# Patient Record
Sex: Male | Born: 1990 | Race: Black or African American | Hispanic: No | Marital: Single | Smoking: Never smoker
Health system: Southern US, Community
[De-identification: ages and names within clinical notes are randomized; demographics above are authoritative.]

## PROBLEM LIST (undated history)

## (undated) DIAGNOSIS — G809 Cerebral palsy, unspecified: Secondary | ICD-10-CM

## (undated) DIAGNOSIS — F840 Autistic disorder: Secondary | ICD-10-CM

## (undated) HISTORY — PX: FOOT CAPSULE RELEASE W/ PERCUTANEOUS HEEL CORD LENGTHENING, TIBIAL TENDON TRANSFER: SHX1658

---
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
## 2018-02-09 ENCOUNTER — Ambulatory Visit (INDEPENDENT_AMBULATORY_CARE_PROVIDER_SITE_OTHER): Payer: Medicaid Other | Admitting: Orthopaedic Surgery

## 2018-02-09 ENCOUNTER — Ambulatory Visit (INDEPENDENT_AMBULATORY_CARE_PROVIDER_SITE_OTHER): Payer: Medicaid Other

## 2018-02-09 ENCOUNTER — Encounter (INDEPENDENT_AMBULATORY_CARE_PROVIDER_SITE_OTHER): Payer: Self-pay | Admitting: Orthopaedic Surgery

## 2018-02-09 VITALS — BP 120/88 | HR 88 | Ht 67.0 in | Wt 314.0 lb

## 2018-02-09 DIAGNOSIS — M25572 Pain in left ankle and joints of left foot: Secondary | ICD-10-CM | POA: Diagnosis not present

## 2018-02-09 DIAGNOSIS — M79672 Pain in left foot: Secondary | ICD-10-CM | POA: Diagnosis not present

## 2018-02-09 NOTE — Progress Notes (Signed)
Office Visit Note   Patient: Albert Horne           Date of Birth: August 05, 1991           MRN: [id number] Visit Date: 02/09/2018              Requested by: No referring provider defined for this encounter. PCP: Patient, No Pcp Per   Assessment & Plan: Visit Diagnoses:  1. Pain in left ankle and joints of left foot   2. Pain in left foot     Plan: Patient needs to go on a diet needs to lose 100 pounds gradually over the next 2 years.  We discussed diet techniques with increased activity less food intake, glass of water before meals using vegetables and fruits, avoiding sugary drinks etc.  He states he likes to eat.  We discussed callus care again in his family needs to debride the callus more vigorously which is just dead hypertrophic skin.  Currently has no blister formation has no tenderness.  Heel cord release loss of foot to dorsiflex to neutral now is no longer walking on his toes.  Hamstrings allowed near full extension of his knees in the sitting position.  Abductors are not tight.  Follow-Up Instructions: Return if symptoms worsen or fail to improve.   Orders:  Orders Placed This Encounter  Procedures  . XR Ankle Complete Left  . XR Foot Complete Left   No orders of the defined types were placed in this encounter.     Procedures: No procedures performed   Clinical Data: No additional findings.   Subjective: Chief Complaint  Patient presents with  . Left Foot - Pain, Edema    S/p slip and fall down steps approximately one month ago  . 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  Bilateral heel cord releases were done.  He has had problems with some planovalgus foot deformity and has callus along the medial aspect of the great toe which remains hypertrophic.  He has not had any blister formation does not really  have any pain with the thick calluses.  He is significantly gained weight and now weighs 314 pounds with a BMI of 49 and is morbidly obese.  Review of Systems positive for previous heel cord releases.  Morbid obesity with significant increased weight gain.  Abnormal gait related to spasticity.   Objective: Vital Signs: BP 120/88 (BP Location: Left Arm, Patient Position: Sitting, Cuff Size: Large)   Pulse 88   Ht 5\' 7"  (1.702 m)   Wt (!) 314 lb (142.4 kg)   BMI 49.18 kg/m   Physical Exam  Constitutional: He is oriented to person, place, and time. He appears well-developed and well-nourished.  Obese  HENT:  Head: Normocephalic and atraumatic.  Eyes: Pupils are equal, round, and reactive to light. EOM are normal.  Neck: No tracheal deviation present. No thyromegaly present.  Cardiovascular: Normal rate.  Pulmonary/Chest: Effort normal. He has no wheezes.  Abdominal: Soft. Bowel sounds are normal.  Neurological: He is alert and oriented to person, place, and time.  Skin: Skin is warm and dry. Capillary refill takes less than 2 seconds.  Psychiatric: He has a normal mood and affect. His behavior is normal. Judgment and thought content normal.    Ortho Exam ankles are dorsiflexed to neutral position.  He ambulates with feet and external rotation drags his foot some and has some pronation.  Thick calluses 1 cm present along the medial aspect of the great toe.  Specialty Comments:  No specialty comments available.  Imaging: Xr Ankle Complete Left  Result Date: 02/09/2018 Three-view x-rays left ankle obtained and reviewed.  There is degenerative spurring anteriorly over the talar neck without corresponding distal tibial spurring.  No evidence of tarsal coalition negative for acute fracture. Impression: Bone formation anteriorly over the neck of the talus.  No ankle or subtalar arthritis.  Xr Foot Complete Left  Result Date: 02/09/2018 Three-view x-rays left foot obtained and reviewed.   Negative for acute fracture.  There is some bone formation dorsal to the neck of the talus without corresponding degenerative spurring on the distal tibia.  No ankle arthrosis.  Negative for acute fracture. Impression negative for acute changes.  Spurring noted dorsal to the talar neck as described above.    PMFS History: There are no active problems to display for this patient.  History reviewed. No pertinent past medical history.  History reviewed. No pertinent family history.  History reviewed. No pertinent surgical history. Social History   Occupational History  . Not on file  Tobacco Use  . Smoking status: Not on file  Substance and Sexual Activity  . Alcohol use: Not on file  . Drug use: Not on file  . Sexual activity: Not on file

## 2022-04-16 ENCOUNTER — Emergency Department (HOSPITAL_COMMUNITY): Payer: Medicaid - Out of State

## 2022-04-16 ENCOUNTER — Inpatient Hospital Stay (HOSPITAL_COMMUNITY)
Admission: EM | Admit: 2022-04-16 | Discharge: 2022-04-20 | DRG: 571 | Disposition: A | Discharge status: Home / Self Care | Payer: Medicaid - Out of State | Attending: Family Medicine | Admitting: Family Medicine

## 2022-04-16 ENCOUNTER — Encounter (HOSPITAL_COMMUNITY): Payer: Self-pay | Admitting: *Deleted

## 2022-04-16 ENCOUNTER — Other Ambulatory Visit: Payer: Self-pay

## 2022-04-16 DIAGNOSIS — L089 Local infection of the skin and subcutaneous tissue, unspecified: Secondary | ICD-10-CM

## 2022-04-16 DIAGNOSIS — L97529 Non-pressure chronic ulcer of other part of left foot with unspecified severity: Secondary | ICD-10-CM | POA: Diagnosis present

## 2022-04-16 DIAGNOSIS — Z713 Dietary counseling and surveillance: Secondary | ICD-10-CM

## 2022-04-16 DIAGNOSIS — D72829 Elevated white blood cell count, unspecified: Secondary | ICD-10-CM | POA: Diagnosis present

## 2022-04-16 DIAGNOSIS — L03116 Cellulitis of left lower limb: Secondary | ICD-10-CM | POA: Diagnosis present

## 2022-04-16 DIAGNOSIS — F840 Autistic disorder: Secondary | ICD-10-CM | POA: Diagnosis present

## 2022-04-16 DIAGNOSIS — Z6841 Body Mass Index (BMI) 40.0 and over, adult: Secondary | ICD-10-CM | POA: Diagnosis not present

## 2022-04-16 DIAGNOSIS — M869 Osteomyelitis, unspecified: Secondary | ICD-10-CM

## 2022-04-16 DIAGNOSIS — G809 Cerebral palsy, unspecified: Secondary | ICD-10-CM | POA: Diagnosis present

## 2022-04-16 DIAGNOSIS — L02612 Cutaneous abscess of left foot: Principal | ICD-10-CM | POA: Diagnosis present

## 2022-04-16 DIAGNOSIS — L0390 Cellulitis, unspecified: Secondary | ICD-10-CM | POA: Diagnosis present

## 2022-04-16 DIAGNOSIS — L7622 Postprocedural hemorrhage and hematoma of skin and subcutaneous tissue following other procedure: Secondary | ICD-10-CM | POA: Diagnosis present

## 2022-04-16 HISTORY — DX: Cerebral palsy, unspecified: G80.9

## 2022-04-16 HISTORY — DX: Autistic disorder: F84.0

## 2022-04-16 LAB — CBC WITH DIFFERENTIAL/PLATELET
Abs Immature Granulocytes: 0.05 10*3/uL (ref 0.00–0.07)
Basophils Absolute: 0.1 10*3/uL (ref 0.0–0.1)
Basophils Relative: 1 %
Eosinophils Absolute: 0.1 10*3/uL (ref 0.0–0.5)
Eosinophils Relative: 1 %
HCT: 46.3 % (ref 39.0–52.0)
Hemoglobin: 15.6 g/dL (ref 13.0–17.0)
Immature Granulocytes: 0 %
Lymphocytes Relative: 18 %
Lymphs Abs: 2.3 10*3/uL (ref 0.7–4.0)
MCH: 31.2 pg (ref 26.0–34.0)
MCHC: 33.7 g/dL (ref 30.0–36.0)
MCV: 92.6 fL (ref 80.0–100.0)
Monocytes Absolute: 1.2 10*3/uL — ABNORMAL HIGH (ref 0.1–1.0)
Monocytes Relative: 9 %
Neutro Abs: 9.3 10*3/uL — ABNORMAL HIGH (ref 1.7–7.7)
Neutrophils Relative %: 71 %
Platelets: 294 10*3/uL (ref 150–400)
RBC: 5 MIL/uL (ref 4.22–5.81)
RDW: 12.6 % (ref 11.5–15.5)
WBC: 13.1 10*3/uL — ABNORMAL HIGH (ref 4.0–10.5)
nRBC: 0 % (ref 0.0–0.2)

## 2022-04-16 LAB — COMPREHENSIVE METABOLIC PANEL
ALT: 19 U/L (ref 0–44)
AST: 19 U/L (ref 15–41)
Albumin: 4.3 g/dL (ref 3.5–5.0)
Alkaline Phosphatase: 68 U/L (ref 38–126)
Anion gap: 7 (ref 5–15)
BUN: 10 mg/dL (ref 6–20)
CO2: 23 mmol/L (ref 22–32)
Calcium: 8.9 mg/dL (ref 8.9–10.3)
Chloride: 109 mmol/L (ref 98–111)
Creatinine, Ser: 0.97 mg/dL (ref 0.61–1.24)
GFR, Estimated: >60 mL/min (ref >60)
Glucose, Bld: 104 mg/dL — ABNORMAL HIGH (ref 70–99)
Potassium: 3.8 mmol/L (ref 3.5–5.1)
Sodium: 139 mmol/L (ref 135–145)
Total Bilirubin: 0.3 mg/dL (ref 0.3–1.2)
Total Protein: 7.8 g/dL (ref 6.5–8.1)

## 2022-04-16 LAB — SEDIMENTATION RATE: Sed Rate: 16 mm/hr (ref 0–16)

## 2022-04-16 MED ORDER — SODIUM CHLORIDE 0.9 % IV BOLUS (SEPSIS)
500.0000 mL | Freq: Once | INTRAVENOUS | Status: AC
  Administered 2022-04-16: 500 mL via INTRAVENOUS

## 2022-04-16 MED ORDER — VANCOMYCIN HCL IN DEXTROSE 1-5 GM/200ML-% IV SOLN
1000.0000 mg | Freq: Once | INTRAVENOUS | Status: DC
  Filled 2022-04-16: qty 200

## 2022-04-16 MED ORDER — VANCOMYCIN HCL 1500 MG/300ML IV SOLN
1500.0000 mg | Freq: Two times a day (BID) | INTRAVENOUS | Status: DC
  Administered 2022-04-17 – 2022-04-20 (×6): 1500 mg via INTRAVENOUS
  Filled 2022-04-16 (×9): qty 300

## 2022-04-16 MED ORDER — SODIUM CHLORIDE 0.9 % IV SOLN: 2.0000 g | INTRAVENOUS | Status: DC

## 2022-04-16 MED ORDER — SODIUM CHLORIDE 0.9 % IV SOLN
2.0000 g | INTRAVENOUS | Status: DC
  Administered 2022-04-17 – 2022-04-18 (×2): 2 g via INTRAVENOUS
  Filled 2022-04-16 (×2): qty 20

## 2022-04-16 MED ORDER — VANCOMYCIN HCL 2000 MG/400ML IV SOLN
2000.0000 mg | Freq: Once | INTRAVENOUS | Status: AC
  Administered 2022-04-16: 2000 mg via INTRAVENOUS
  Filled 2022-04-16: qty 400

## 2022-04-16 MED ORDER — SODIUM CHLORIDE 0.9 % IV SOLN
2.0000 g | Freq: Once | INTRAVENOUS | Status: AC
  Administered 2022-04-16: 2 g via INTRAVENOUS
  Filled 2022-04-16: qty 12.5

## 2022-04-16 MED ORDER — SODIUM CHLORIDE 0.9 % IV SOLN: 1000.0000 mL | INTRAVENOUS | Status: DC

## 2022-04-16 NOTE — ED Provider Notes (Signed)
[facility] EMERGENCY DEPARTMENT Provider Note   CSN: [id number] Arrival date & time: 04/16/22  2031     History  Chief Complaint  Patient presents with   Foot Swelling    Aran A Mells is a 31 y.o. male.  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 Lorin Mercy with orthopedics.  He had bilateral heel cord releases in 2003 when he was 31 years old.  He presents to the emergency department today due to pain and swelling in the left foot.  His mother is at bedside and also provides a portion of his history.  They state that about 2 weeks ago he had pain and swelling along the dorsum of the left foot which resolved.  Yesterday it returned and began worsening.  Reports exquisite pain in the dorsum of the foot as well as pain and difficulty with ambulation.  No numbness, fevers, chills, nausea, vomiting.    Home Medications Prior to Admission medications   Not on File      Allergies    Patient has no known allergies.    Review of Systems   Review of Systems  All other systems reviewed and are negative. Ten systems reviewed and are negative for acute change, except as noted in the HPI.   Physical Exam Updated Vital Signs BP 127/87 (BP Location: Left Arm)   Pulse 98   Temp 99 F (37.2 C) (Oral)   Resp 18   Ht 5' 6"  (1.676 m)   Wt (!) 142.9 kg   SpO2 99%   BMI 50.84 kg/m  Physical Exam Vitals and nursing note reviewed.  Constitutional:      General: He is not in acute distress.    Appearance: He is well-developed.  HENT:     Head: Normocephalic and atraumatic.     Right Ear: External ear normal.     Left Ear: External ear normal.  Eyes:     General: No scleral icterus.       Right eye: No discharge.        Left eye: No discharge.     Conjunctiva/sclera: Conjunctivae normal.  Neck:     Trachea: No tracheal deviation.  Cardiovascular:     Rate and Rhythm: Normal rate.  Pulmonary:      Effort: Pulmonary effort is normal. No respiratory distress.     Breath sounds: No stridor.  Abdominal:     General: There is no distension.  Musculoskeletal:        General: Swelling and tenderness present. No deformity.     Cervical back: Neck supple.     Comments: Please see images below of the left foot.  Palpable pedal pulses.  Wiggling the toes.  Good cap refill.  Distal sensation intact.  Exquisite tenderness noted overlying the swelling in the dorsum of the left foot.  Skin:    General: Skin is warm and dry.     Findings: Erythema present. No rash.  Neurological:     Mental Status: He is alert.     Cranial Nerves: Cranial nerve deficit: no gross deficits.      ED Results / Procedures / Treatments   Labs (all labs ordered are listed, but only abnormal results are displayed) Labs Reviewed  CBC WITH DIFFERENTIAL/PLATELET - Abnormal; Notable for the following components:      Result Value   WBC 13.1 (*)    Neutro Abs 9.3 (*)  Monocytes Absolute 1.2 (*)    All other components within normal limits  COMPREHENSIVE METABOLIC PANEL - Abnormal; Notable for the following components:   Glucose, Bld 104 (*)    All other components within normal limits  SEDIMENTATION RATE  C-REACTIVE PROTEIN    EKG None  Radiology DG Foot Complete Left  Result Date: 04/16/2022 CLINICAL DATA:  Pain and swelling. EXAM: LEFT FOOT - COMPLETE 3+ VIEW COMPARISON:  Left foot x-ray 02/09/2018. FINDINGS: There is soft tissue swelling of the dorsum of the foot and of the first toe. Ulceration is seen overlying the medial aspect of the first toe. There is no acute fracture or dislocation. There are new minimal areas of cortical irregularity over the dorsal aspect of the navicular bone seen on the lateral view. Chronic changes of the dorsal talus or similar to the prior study. IMPRESSION: 1. Soft tissue swelling of the dorsum of the foot and first toe with first toe ulceration. 2. Cortical irregularity  dorsal aspect of the navicular bone. Can not exclude osteomyelitis in the appropriate clinical setting. Electronically Signed   By: Ronney Asters M.D.   On: 04/16/2022 22:11    Procedures Procedures   Medications Ordered in ED Medications  vancomycin (VANCOCIN) IVPB 1000 mg/200 mL premix (has no administration in time range)  ceFEPIme (MAXIPIME) 2 g in sodium chloride 0.9 % 100 mL IVPB (has no administration in time range)  sodium chloride 0.9 % bolus 500 mL (has no administration in time range)    ED Course/ Medical Decision Making/ A&P                           Medical Decision Making Amount and/or Complexity of Data Reviewed Labs: ordered. Radiology: ordered.  Risk Prescription drug management. Decision regarding hospitalization.  Pt is a 31 y.o. male who presents to the emergency department due to atraumatic pain and swelling along the dorsum of the left foot.  Labs: CBC with a white count of 13.1, neutrophils of 9.3, monocytes of 1.2. CMP with a glucose of 104. ESR and CRP are pending.  Imaging: X-ray of the left foot shows soft tissue swelling of the dorsum of the foot and first toe with first toe ulceration.  Cortical irregularity along the dorsal aspect of the navicular bone.  Cannot exclude osteomyelitis in the appropriate clinical setting.  I, Rayna Sexton, PA-C, personally reviewed and evaluated these images and lab results as part of my medical decision-making.  Please see images above the left foot.  Neurovascularly intact distal to the swelling.  Patient discussed with Dr. Ninfa Linden who is on-call for Ortho care (patient previously seen by Dr. Lorin Mercy).  We reviewed patient's images, lab work, and history.  Recommends medical admission for IV antibiotics and close monitoring.  States that patient can be admitted to [facility] or sent to [facility] in [place].  Does not recommend CT imaging but consideration for follow-up MRI if needed.  Patient started on IV  fluids, vancomycin, as well as cefepime.  This plan was discussed with the patient as well as his parents at bedside who are agreeable.  We will discuss with the medicine team at this time.  Note: Portions of this report may have been transcribed using voice recognition software. Every effort was made to ensure accuracy; however, inadvertent computerized transcription errors may be present.   Final Clinical Impression(s) / ED Diagnoses Final diagnoses:  Cellulitis of left foot   Rx /  DC Orders ED Discharge Orders     None         Rayna Sexton, Hershal Coria 04/16/22 2239    Milton Ferguson, MD 04/19/22 [phone]

## 2022-04-16 NOTE — H&P (Signed)
History and Physical    Patient: Albert Horne RKY:[id number] DOB: 06-Jan-1991 DOA: 04/16/2022 DOS: the patient was seen and examined on 04/16/2022 PCP: Patient, [name]  Patient coming from: Home  Chief Complaint:  Chief Complaint  Patient presents with   Foot Swelling   HPI: Albert Horne is a 31 y.o. male with medical history significant of autism and obesity presents to the ED with a chief complaint of left foot pain. Patient reports that two weeks ago the foot was swollen for several days. They did epsom salt soaks and cleaned it with alcohol and hydrogen peroxide. The swelling went away. Yesterday, the swelling came back, and then this morning the foot was erythematous and painful. He describes the pain as pressure and sharp. It is intermittent, worse with weight bearing and not necessarily better with rest. Patient denies any trauma. He has not had fever or malaise. He has no other complaints at this time.   Patient does not smoke, does not drink EtOH, does not use illicit drugs. He is vaxed for covid. Patient is full code.  Review of Systems: As mentioned in the history of present illness. All other systems reviewed and are negative. Past Medical History:  Diagnosis Date   Autism    Cerebral palsy ([place])    Past Surgical History:  Procedure Laterality Date   FOOT CAPSULE RELEASE W/ PERCUTANEOUS HEEL CORD LENGTHENING, TIBIAL TENDON TRANSFER Bilateral    Social History:  reports that he has never smoked. He has never used smokeless tobacco. He reports that he does not drink alcohol and does not use drugs.  No Known Allergies  History reviewed. No pertinent family history.  Prior to Admission medications   Not on File    Physical Exam: Vitals:   04/16/22 2037 04/16/22 2040  BP:  127/87  Pulse:  98  Resp:  18  Temp:  99 F (37.2 C)  TempSrc:  Oral  SpO2:  99%  Weight: (!) 142.9 kg   Height: _0  (1.676 m)    1.  General: Patient lying supine  in bed,  no acute distress   2. Psychiatric: Alert and oriented x 3, mood and behavior normal for situation, pleasant and cooperative with exam   3. Neurologic: Speech and language are normal, face is symmetric, moves all 4 extremities voluntarily, at baseline without acute deficits on limited exam   4. HEENMT:  Head is atraumatic, normocephalic, pupils reactive to light, neck is supple, trachea is midline, mucous membranes are moist   5. Respiratory : Lungs are clear to auscultation bilaterally without wheezing, rhonchi, rales, no cyanosis, no increase in work of breathing or accessory muscle use   6. Cardiovascular : Heart rate normal, rhythm is regular, no murmurs, rubs or gallops, no peripheral edema, peripheral pulses palpated   7. Gastrointestinal:  Abdomen is soft, nondistended, nontender to palpation bowel sounds active, no masses or organomegaly palpated   8. Skin:  Calluses of both feet on the great toe and medial aspect. Ulceration of the left great toe. Dorsal edema and erythema.    9.Musculoskeletal:  No acute  trauma, no asymmetry in tone, no peripheral edema, peripheral pulses palpated, no tenderness to palpation in the extremities  Data Reviewed: Afebrile, vitals stable Leukocytosis 13.1 Chem is unremarkable Xray left foot - swelling and new cortical irregularity - can't rule out osteomyelitis Cefepime, Vanc, and 520mbolus in ED ESR and CRP pending   Assessment and Plan: * Osteomyelitis (HCC) -Left  foot edema and erythema -Leukocytosis -Xray left shows new cortical irregularity -MRI to eval for osteomyelitis -ESR and CRP pending -Ortho consuted, Dr. Colleen Can, and advises medical management at this time -Vanc and cefepime started in ED -Continue Vanc and Rocephin -As we do not have ortho at [facility] on weekends, will admit to [facility].   Leukocytosis -leukocytosis 13.1 -2/2 left lower extremity infection      Advance Care Planning:   Code Status: Not on file  full code  Consults: Ortho  Family Communication: Mother and father at bedside  Severity of Illness: The appropriate patient status for this patient is INPATIENT. Inpatient status is judged to be reasonable and necessary in order to provide the required intensity of service to ensure the patient's safety. The patient's presenting symptoms, physical exam findings, and initial radiographic and laboratory data in the context of their chronic comorbidities is felt to place them at high risk for further clinical deterioration. Furthermore, it is not anticipated that the patient will be medically stable for discharge from the hospital within 2 midnights of admission.   * I certify that at the point of admission it is my clinical judgment that the patient will require inpatient hospital care spanning beyond 2 midnights from the point of admission due to high intensity of service, high risk for further deterioration and high frequency of surveillance required.*  Author: Rolla Plate, DO 04/16/2022 11:06 PM  For on call review [email or web address].

## 2022-04-16 NOTE — Assessment & Plan Note (Addendum)
-  Left foot edema and erythema -Leukocytosis -Xray left shows new cortical irregularity -MRI to eval for osteomyelitis -ESR and CRP pending -Ortho consuted, Dr. Colleen Can, and advises medical management at this time -Vanc and cefepime started in ED -Continue Vanc and Rocephin -As we do not have ortho at [facility] on weekends, will admit to [facility].

## 2022-04-16 NOTE — Assessment & Plan Note (Signed)
resolved 

## 2022-04-16 NOTE — ED Triage Notes (Signed)
Pt with left foot swelling since yesterday.  Swelling noted last week per family member but swelling resolved.  Pt denies any injury to foot. Not able to put weight on foot.

## 2022-04-17 ENCOUNTER — Inpatient Hospital Stay (HOSPITAL_COMMUNITY): Payer: Medicaid - Out of State

## 2022-04-17 DIAGNOSIS — L03116 Cellulitis of left lower limb: Secondary | ICD-10-CM

## 2022-04-17 LAB — COMPREHENSIVE METABOLIC PANEL
ALT: 18 U/L (ref 0–44)
AST: 14 U/L — ABNORMAL LOW (ref 15–41)
Albumin: 3.5 g/dL (ref 3.5–5.0)
Alkaline Phosphatase: 54 U/L (ref 38–126)
Anion gap: 6 (ref 5–15)
BUN: 8 mg/dL (ref 6–20)
CO2: 26 mmol/L (ref 22–32)
Calcium: 8.9 mg/dL (ref 8.9–10.3)
Chloride: 108 mmol/L (ref 98–111)
Creatinine, Ser: 1.03 mg/dL (ref 0.61–1.24)
GFR, Estimated: >60 mL/min (ref >60)
Glucose, Bld: 106 mg/dL — ABNORMAL HIGH (ref 70–99)
Potassium: 4 mmol/L (ref 3.5–5.1)
Sodium: 140 mmol/L (ref 135–145)
Total Bilirubin: 0.8 mg/dL (ref 0.3–1.2)
Total Protein: 6.9 g/dL (ref 6.5–8.1)

## 2022-04-17 LAB — CBC WITH DIFFERENTIAL/PLATELET
Abs Immature Granulocytes: 0.04 10*3/uL (ref 0.00–0.07)
Basophils Absolute: 0 10*3/uL (ref 0.0–0.1)
Basophils Relative: 0 %
Eosinophils Absolute: 0.1 10*3/uL (ref 0.0–0.5)
Eosinophils Relative: 1 %
HCT: 40.8 % (ref 39.0–52.0)
Hemoglobin: 13.7 g/dL (ref 13.0–17.0)
Immature Granulocytes: 0 %
Lymphocytes Relative: 23 %
Lymphs Abs: 3 10*3/uL (ref 0.7–4.0)
MCH: 30.9 pg (ref 26.0–34.0)
MCHC: 33.6 g/dL (ref 30.0–36.0)
MCV: 91.9 fL (ref 80.0–100.0)
Monocytes Absolute: 1.2 10*3/uL — ABNORMAL HIGH (ref 0.1–1.0)
Monocytes Relative: 9 %
Neutro Abs: 8.7 10*3/uL — ABNORMAL HIGH (ref 1.7–7.7)
Neutrophils Relative %: 67 %
Platelets: 297 10*3/uL (ref 150–400)
RBC: 4.44 MIL/uL (ref 4.22–5.81)
RDW: 12.3 % (ref 11.5–15.5)
WBC: 13.2 10*3/uL — ABNORMAL HIGH (ref 4.0–10.5)
nRBC: 0 % (ref 0.0–0.2)

## 2022-04-17 LAB — PREALBUMIN: Prealbumin: 20.6 mg/dL (ref 18–38)

## 2022-04-17 LAB — HEMOGLOBIN A1C
Hgb A1c MFr Bld: 4.8 % (ref 4.8–5.6)
Mean Plasma Glucose: 91.06 mg/dL

## 2022-04-17 LAB — MAGNESIUM: Magnesium: 2 mg/dL (ref 1.7–2.4)

## 2022-04-17 LAB — C-REACTIVE PROTEIN: CRP: 3.1 mg/dL — ABNORMAL HIGH (ref <1.0)

## 2022-04-17 LAB — HIV ANTIBODY (ROUTINE TESTING W REFLEX): HIV Screen 4th Generation wRfx: NONREACTIVE

## 2022-04-17 MED ORDER — SODIUM CHLORIDE 0.9 % IV SOLN: INTRAVENOUS | Status: DC

## 2022-04-17 MED ORDER — HEPARIN SODIUM (PORCINE) 5000 UNIT/ML IJ SOLN
5000.0000 [IU] | Freq: Three times a day (TID) | INTRAMUSCULAR | Status: DC
  Administered 2022-04-17 – 2022-04-20 (×9): 5000 [IU] via SUBCUTANEOUS
  Filled 2022-04-17 (×9): qty 1

## 2022-04-17 MED ORDER — GADOBUTROL 1 MMOL/ML IV SOLN
10.0000 mL | Freq: Once | INTRAVENOUS | Status: AC | PRN
  Administered 2022-04-17: 10 mL via INTRAVENOUS

## 2022-04-17 MED ORDER — ONDANSETRON HCL 4 MG/2ML IJ SOLN: 4.0000 mg | Freq: Four times a day (QID) | INTRAMUSCULAR | Status: DC | PRN

## 2022-04-17 MED ORDER — ACETAMINOPHEN 325 MG PO TABS: 650.0000 mg | ORAL_TABLET | Freq: Four times a day (QID) | ORAL | Status: DC | PRN

## 2022-04-17 MED ORDER — OXYCODONE HCL 5 MG PO TABS: 5.0000 mg | ORAL_TABLET | ORAL | Status: DC | PRN

## 2022-04-17 MED ORDER — MORPHINE SULFATE (PF) 2 MG/ML IV SOLN: 2.0000 mg | INTRAVENOUS | Status: DC | PRN

## 2022-04-17 MED ORDER — ACETAMINOPHEN 650 MG RE SUPP: 650.0000 mg | Freq: Four times a day (QID) | RECTAL | Status: DC | PRN

## 2022-04-17 MED ORDER — ONDANSETRON HCL 4 MG PO TABS: 4.0000 mg | ORAL_TABLET | Freq: Four times a day (QID) | ORAL | Status: DC | PRN

## 2022-04-17 NOTE — Plan of Care (Signed)
  Problem: Nutrition: Goal: Adequate nutrition will be maintained Outcome: Progressing   Problem: Pain Managment: Goal: General experience of comfort will improve Outcome: Progressing   Problem: Safety: Goal: Ability to remain free from injury will improve Outcome: Progressing   

## 2022-04-17 NOTE — Consult Note (Signed)
 ORTHOPAEDIC CONSULTATION  REQUESTING PHYSICIAN: Pahwani, Rinka R, MD  Chief Complaint: Swelling and cellulitis left foot.  HPI: Albert Horne is a 31 y.o. male who presents with swelling and cellulitis left foot.  Patient denies any acute injury.  Patient states he has not had a infection before in the left foot.  Patient is status post foot surgery in the past which is most likely because of the cortical irregularity of the navicular.  Past Medical History:  Diagnosis Date   Autism    Cerebral palsy (HCC)    Past Surgical History:  Procedure Laterality Date   FOOT CAPSULE RELEASE W/ PERCUTANEOUS HEEL CORD LENGTHENING, TIBIAL TENDON TRANSFER Bilateral    Social History   Socioeconomic History   Marital status: Single    Spouse name: Not on file   Number of children: Not on file   Years of education: Not on file   Highest education level: Not on file  Occupational History   Not on file  Tobacco Use   Smoking status: Never   Smokeless tobacco: Never  Substance and Sexual Activity   Alcohol use: Never   Drug use: Never   Sexual activity: Not on file  Other Topics Concern   Not on file  Social History Narrative   Not on file   Social Determinants of Health   Financial Resource Strain: Not on file  Food Insecurity: Not on file  Transportation Needs: Not on file  Physical Activity: Not on file  Stress: Not on file  Social Connections: Not on file   History reviewed. No pertinent family history. - negative except otherwise stated in the family history section No Known Allergies Prior to Admission medications   Not on File   DG Foot Complete Left  Result Date: 04/16/2022 CLINICAL DATA:  Pain and swelling. EXAM: LEFT FOOT - COMPLETE 3+ VIEW COMPARISON:  Left foot x-ray 02/09/2018. FINDINGS: There is soft tissue swelling of the dorsum of the foot and of the first toe. Ulceration is seen overlying the medial aspect of the first toe. There is no acute fracture or  dislocation. There are new minimal areas of cortical irregularity over the dorsal aspect of the navicular bone seen on the lateral view. Chronic changes of the dorsal talus or similar to the prior study. IMPRESSION: 1. Soft tissue swelling of the dorsum of the foot and first toe with first toe ulceration. 2. Cortical irregularity dorsal aspect of the navicular bone. Can not exclude osteomyelitis in the appropriate clinical setting. Electronically Signed   By: Amy  Guttmann M.D.   On: 04/16/2022 22:11   - pertinent xrays, CT, MRI studies were reviewed and independently interpreted  Positive ROS: All other systems have been reviewed and were otherwise negative with the exception of those mentioned in the HPI and as above.  Physical Exam: General: Alert, no acute distress Psychiatric: Patient is competent for consent with normal mood and affect Lymphatic: No axillary or cervical lymphadenopathy Cardiovascular: No pedal edema Respiratory: No cyanosis, no use of accessory musculature GI: No organomegaly, abdomen is soft and non-tender    Images:  @ENCIMAGES@  Labs:  Lab Results  Component Value Date   HGBA1C 4.8 04/17/2022   ESRSEDRATE 16 04/16/2022    Lab Results  Component Value Date   ALBUMIN 3.5 04/17/2022   ALBUMIN 4.3 04/16/2022   PREALBUMIN 20.6 04/17/2022        Latest Ref Rng & Units 04/17/2022    1:08 AM 04/16/2022      9:18 PM  CBC EXTENDED  WBC 4.0 - 10.5 K/uL 13.2   13.1    RBC 4.22 - 5.81 MIL/uL 4.44   5.00    Hemoglobin 13.0 - 17.0 g/dL 13.7   15.6    HCT 39.0 - 52.0 % 40.8   46.3    Platelets 150 - 400 K/uL 297   294    NEUT# 1.7 - 7.7 K/uL 8.7   9.3    Lymph# 0.7 - 4.0 K/uL 3.0   2.3      Neurologic: Patient does not have protective sensation bilateral lower extremities.   MUSCULOSKELETAL:   Skin: Examination patient has cellulitis and swelling of the left midfoot.  I cannot palpate a pulse secondary to swelling in both extremities.  There is no open  ulcer.  Review of the radiograph shows cortical irregularity on the dorsum of the navicular.  This may be due to the soft tissue foot surgery that was performed when he was a child.  White blood cell count 13.3  MRI scan is pending.  Assessment: Assessment: Swelling cellulitis abscess left dorsal midfoot.  Plan: Plan: We will plan for surgical debridement tomorrow.  Anticipate obtaining the MRI scan today to assist with surgical planning.  I discussed recommendation with the patient and his mother on the phone.  They state they both understand and wish to proceed with surgery at this time.  Thank you for the consult and the opportunity to see Mr. Altemus  Albert Upshaw, MD [facility] [phone] 9:27 AM      

## 2022-04-17 NOTE — H&P (View-Only) (Signed)
ORTHOPAEDIC CONSULTATION  REQUESTING PHYSICIAN: Pahwani, Michell Heinrich, MD  Chief Complaint: Swelling and cellulitis left foot.  HPI: Albert Horne is a 31 y.o. male who presents with swelling and cellulitis left foot.  Patient denies any acute injury.  Patient states he has not had a infection before in the left foot.  Patient is status post foot surgery in the past which is most likely because of the cortical irregularity of the navicular.  Past Medical History:  Diagnosis Date   Autism    Cerebral palsy ([place])    Past Surgical History:  Procedure Laterality Date   FOOT CAPSULE RELEASE W/ PERCUTANEOUS HEEL CORD LENGTHENING, TIBIAL TENDON TRANSFER Bilateral    Social History   Socioeconomic History   Marital status: Single    Spouse name: Not on file   Number of children: Not on file   Years of education: Not on file   Highest education level: Not on file  Occupational History   Not on file  Tobacco Use   Smoking status: Never   Smokeless tobacco: Never  Substance and Sexual Activity   Alcohol use: Never   Drug use: Never   Sexual activity: Not on file  Other Topics Concern   Not on file  Social History Narrative   Not on file   Social Determinants of Health   Financial Resource Strain: Not on file  Food Insecurity: Not on file  Transportation Needs: Not on file  Physical Activity: Not on file  Stress: Not on file  Social Connections: Not on file   History reviewed. No pertinent family history. - negative except otherwise stated in the family history section No Known Allergies Prior to Admission medications   Not on File   DG Foot Complete Left  Result Date: 04/16/2022 CLINICAL DATA:  Pain and swelling. EXAM: LEFT FOOT - COMPLETE 3+ VIEW COMPARISON:  Left foot x-ray 02/09/2018. FINDINGS: There is soft tissue swelling of the dorsum of the foot and of the first toe. Ulceration is seen overlying the medial aspect of the first toe. There is no acute fracture or  dislocation. There are new minimal areas of cortical irregularity over the dorsal aspect of the navicular bone seen on the lateral view. Chronic changes of the dorsal talus or similar to the prior study. IMPRESSION: 1. Soft tissue swelling of the dorsum of the foot and first toe with first toe ulceration. 2. Cortical irregularity dorsal aspect of the navicular bone. Can not exclude osteomyelitis in the appropriate clinical setting. Electronically Signed   By: Ronney Asters M.D.   On: 04/16/2022 22:11   - pertinent xrays, CT, MRI studies were reviewed and independently interpreted  Positive ROS: All other systems have been reviewed and were otherwise negative with the exception of those mentioned in the HPI and as above.  Physical Exam: General: Alert, no acute distress Psychiatric: Patient is competent for consent with normal mood and affect Lymphatic: No axillary or cervical lymphadenopathy Cardiovascular: No pedal edema Respiratory: No cyanosis, no use of accessory musculature GI: No organomegaly, abdomen is soft and non-tender    Images:  @ENCIMAGES @  Labs:  Lab Results  Component Value Date   HGBA1C 4.8 04/17/2022   ESRSEDRATE 16 04/16/2022    Lab Results  Component Value Date   ALBUMIN 3.5 04/17/2022   ALBUMIN 4.3 04/16/2022   PREALBUMIN 20.6 04/17/2022        Latest Ref Rng & Units 04/17/2022    1:08 AM 04/16/2022  9:18 PM  CBC EXTENDED  WBC 4.0 - 10.5 K/uL 13.2   13.1    RBC 4.22 - 5.81 MIL/uL 4.44   5.00    Hemoglobin 13.0 - 17.0 g/dL 13.7   15.6    HCT 39.0 - 52.0 % 40.8   46.3    Platelets 150 - 400 K/uL 297   294    NEUT# 1.7 - 7.7 K/uL 8.7   9.3    Lymph# 0.7 - 4.0 K/uL 3.0   2.3      Neurologic: Patient does not have protective sensation bilateral lower extremities.   MUSCULOSKELETAL:   Skin: Examination patient has cellulitis and swelling of the left midfoot.  I cannot palpate a pulse secondary to swelling in both extremities.  There is no open  ulcer.  Review of the radiograph shows cortical irregularity on the dorsum of the navicular.  This may be due to the soft tissue foot surgery that was performed when he was a child.  White blood cell count 13.3  MRI scan is pending.  Assessment: Assessment: Swelling cellulitis abscess left dorsal midfoot.  Plan: Plan: We will plan for surgical debridement tomorrow.  Anticipate obtaining the MRI scan today to assist with surgical planning.  I discussed recommendation with the patient and his mother on the phone.  They state they both understand and wish to proceed with surgery at this time.  Thank you for the consult and the opportunity to see Albert Horne, [place] [phone] 9:27 AM

## 2022-04-17 NOTE — Progress Notes (Signed)
Pharmacy Antibiotic Note  Albert Horne is a 31 y.o. male admitted on 04/16/2022 with  concern for osteomyelitis .  Pharmacy has been consulted for vancomycin dosing.  Plan: Vancomycin 2000mg  x1 then 1500mg  IV Q12H. Goal AUC 400-550.  Expected AUC 480.  Height: 5\' 6"  (167.6 cm) Weight: (!) 142.9 kg (315 lb) IBW/kg (Calculated) : 63.8  Temp (24hrs), Avg:99 F (37.2 C), Min:99 F (37.2 C), Max:99 F (37.2 C)  Recent Labs  Lab 04/16/22 2118  WBC 13.1*  CREATININE 0.97    Estimated Creatinine Clearance: 150.3 mL/min (by C-G formula based on SCr of 0.97 mg/dL).    No Known Allergies   Thank you for allowing pharmacy to be a part of this patient's care.  , PharmD, BCPS  04/17/2022 12:08 AM

## 2022-04-17 NOTE — Progress Notes (Signed)
PROGRESS NOTE    Albert Horne  JQD:[id number] DOB: [birth year] DOA: 04/16/2022 PCP: Albert Horne, [name]   Brief Narrative:   Albert Horne is a 31 y.o. male with medical history significant of autism and obesity presents to the ED with a chief complaint of left foot pain swelling and redness for several days.  Upon arrival to ED: Albert Horne afebrile, vital signs stable.  CBC shows leukocytosis of 13.1.  A1c: 4.8.  HIV negative.  Normal sodium, potassium and liver enzymes and kidney function.  X-ray of left foot shows soft tissue swelling of the dorsum of the foot and first toe with first toe ulceration.  Cannot exclude osteomyelitis.  Albert Horne started on vancomycin and cefepime.  Ortho consulted who recommended transfer Albert Horne to [facility] for MRI and further evaluation.  Assessment & Plan:  Cellulitis of left foot: -Reviewed x-ray of left foot cannot exclude osteomyelitis.  MRI left foot pending. -Albert Horne is currently afebrile with leukocytosis of 13.2.  CRP pending, sed rate: 16. -Hemoglobin: 4.8.  HIV negative -Continue as needed pain medications.  Continue Rocephin and vancomycin -Ortho recommended surgical debridement likely tomorrow-appreciate help -We will keep him n.p.o. after midnight  Morbid obesity with BMI of 50: Diet modification, exercise and weight loss recommended  History of autism: - at baseline  DVT prophylaxis: SCD, heparin Code Status: Full code Family Communication:  None present at bedside.  Plan of care discussed with Albert Horne in length and he verbalized understanding and agreed with it. Disposition Plan: To be determined  Consultants:  Orthopedic surgery  Procedures:  None  Antimicrobials:  Rocephin Vancomycin  Status is: Inpatient   Subjective: Albert Horne seen and examined.  Resting comfortably on the bed.  Has left foot swelling redness and pain.  No fever, chills.  No decreased appetite, generalized weakness or lethargy.  No  history of diabetes or recent trauma.  Objective: Vitals:   04/17/22 0010 04/17/22 0059 04/17/22 0427 04/17/22 0858  BP: 109/68 (!) 151/77 127/71 132/74  Pulse: 89 86 85 86  Resp: 17 16 18 17   Temp: 98.7 F (37.1 C) 98.1 F (36.7 C) 98.5 F (36.9 C) 98.1 F (36.7 C)  TempSrc: Oral Oral Oral Oral  SpO2: 95% 97% 98% 97%  Weight:      Height:        Intake/Output Summary (Last 24 hours) at 04/17/2022 0958 Last data filed at 04/17/2022 0858 Gross per 24 hour  Intake 1287.82 ml  Output 450 ml  Net 837.82 ml   Filed Weights   04/16/22 2037  Weight: (!) 142.9 kg    Examination:  General exam: Appears calm and comfortable, on room air, communicating well, obese Respiratory system: Clear to auscultation. Respiratory effort normal. Cardiovascular system: S1 & S2 heard, RRR. No JVD, murmurs, rubs, gallops or clicks. No pedal edema. Gastrointestinal system: Abdomen is nondistended, soft and nontender. No organomegaly or masses felt. Normal bowel sounds heard. Central nervous system: Alert and oriented. No focal neurological deficits. Extremities:     Psychiatry: Judgement and insight appear normal. Mood & affect appropriate.    Data Reviewed: I have personally reviewed following labs and imaging studies  CBC: Recent Labs  Lab 04/16/22 2118 04/17/22 0108  WBC 13.1* 13.2*  NEUTROABS 9.3* 8.7*  HGB 15.6 13.7  HCT 46.3 40.8  MCV 92.6 91.9  PLT 294 297   Basic Metabolic Panel: Recent Labs  Lab 04/16/22 2118 04/17/22 0108  NA 139 140  K 3.8 4.0  CL 109  108  CO2 23 26  GLUCOSE 104* 106*  BUN 10 8  CREATININE 0.97 1.03  CALCIUM 8.9 8.9  MG  --  2.0   GFR: Estimated Creatinine Clearance: 141.5 mL/min (by C-G formula based on SCr of 1.03 mg/dL). Liver Function Tests: Recent Labs  Lab 04/16/22 2118 04/17/22 0108  AST 19 14*  ALT 19 18  ALKPHOS 68 54  BILITOT 0.3 0.8  PROT 7.8 6.9  ALBUMIN 4.3 3.5   No results for input(s): LIPASE, AMYLASE in the last 168  hours. No results for input(s): AMMONIA in the last 168 hours. Coagulation Profile: No results for input(s): INR, PROTIME in the last 168 hours. Cardiac Enzymes: No results for input(s): CKTOTAL, CKMB, CKMBINDEX, TROPONINI in the last 168 hours. BNP (last 3 results) No results for input(s): PROBNP in the last 8760 hours. HbA1C: Recent Labs    04/17/22 0108  HGBA1C 4.8   CBG: No results for input(s): GLUCAP in the last 168 hours. Lipid Profile: No results for input(s): CHOL, HDL, LDLCALC, TRIG, CHOLHDL, LDLDIRECT in the last 72 hours. Thyroid Function Tests: No results for input(s): TSH, T4TOTAL, FREET4, T3FREE, THYROIDAB in the last 72 hours. Anemia Panel: No results for input(s): VITAMINB12, FOLATE, FERRITIN, TIBC, IRON, RETICCTPCT in the last 72 hours. Sepsis Labs: No results for input(s): PROCALCITON, LATICACIDVEN in the last 168 hours.  No results found for this or any previous visit (from the past 240 hour(s)).    Radiology Studies: DG Foot Complete Left  Result Date: 04/16/2022 CLINICAL DATA:  Pain and swelling. EXAM: LEFT FOOT - COMPLETE 3+ VIEW COMPARISON:  Left foot x-ray 02/09/2018. FINDINGS: There is soft tissue swelling of the dorsum of the foot and of the first toe. Ulceration is seen overlying the medial aspect of the first toe. There is no acute fracture or dislocation. There are new minimal areas of cortical irregularity over the dorsal aspect of the navicular bone seen on the lateral view. Chronic changes of the dorsal talus or similar to the prior study. IMPRESSION: 1. Soft tissue swelling of the dorsum of the foot and first toe with first toe ulceration. 2. Cortical irregularity dorsal aspect of the navicular bone. Can not exclude osteomyelitis in the appropriate clinical setting. Electronically Signed   By: Darliss Cheney M.D.   On: 04/16/2022 22:11    Scheduled Meds:  heparin  5,000 Units Subcutaneous Q8H   Continuous Infusions:  sodium chloride 100 mL/hr at  04/17/22 0111   cefTRIAXone (ROCEPHIN)  IV 2 g (04/17/22 0932)   vancomycin       LOS: 1 day   Time spent: 35 minutes   Luismario Coston Estill Cotta, MD [facility]  If 7PM-7AM, please contact night-coverage [email or web address] 04/17/2022, 9:58 AM

## 2022-04-18 ENCOUNTER — Encounter (HOSPITAL_COMMUNITY): Payer: Self-pay | Admitting: Family Medicine

## 2022-04-18 ENCOUNTER — Encounter (HOSPITAL_COMMUNITY)
Admission: EM | Disposition: A | Discharge status: Home / Self Care | Payer: Self-pay | Source: Home / Self Care | Attending: Internal Medicine

## 2022-04-18 ENCOUNTER — Inpatient Hospital Stay (HOSPITAL_COMMUNITY): Payer: Medicaid - Out of State | Admitting: Certified Registered"

## 2022-04-18 DIAGNOSIS — M86272 Subacute osteomyelitis, left ankle and foot: Secondary | ICD-10-CM

## 2022-04-18 DIAGNOSIS — Z6841 Body Mass Index (BMI) 40.0 and over, adult: Secondary | ICD-10-CM

## 2022-04-18 DIAGNOSIS — G709 Myoneural disorder, unspecified: Secondary | ICD-10-CM

## 2022-04-18 DIAGNOSIS — L02612 Cutaneous abscess of left foot: Secondary | ICD-10-CM

## 2022-04-18 HISTORY — PX: I & D EXTREMITY: SHX5045

## 2022-04-18 LAB — CBC WITH DIFFERENTIAL/PLATELET
Abs Immature Granulocytes: 0.04 10*3/uL (ref 0.00–0.07)
Basophils Absolute: 0.1 10*3/uL (ref 0.0–0.1)
Basophils Relative: 1 %
Eosinophils Absolute: 0.2 10*3/uL (ref 0.0–0.5)
Eosinophils Relative: 2 %
HCT: 39.7 % (ref 39.0–52.0)
Hemoglobin: 13 g/dL (ref 13.0–17.0)
Immature Granulocytes: 1 %
Lymphocytes Relative: 27 %
Lymphs Abs: 2.4 10*3/uL (ref 0.7–4.0)
MCH: 30.3 pg (ref 26.0–34.0)
MCHC: 32.7 g/dL (ref 30.0–36.0)
MCV: 92.5 fL (ref 80.0–100.0)
Monocytes Absolute: 1 10*3/uL (ref 0.1–1.0)
Monocytes Relative: 11 %
Neutro Abs: 5.1 10*3/uL (ref 1.7–7.7)
Neutrophils Relative %: 58 %
Platelets: 281 10*3/uL (ref 150–400)
RBC: 4.29 MIL/uL (ref 4.22–5.81)
RDW: 12.4 % (ref 11.5–15.5)
WBC: 8.8 10*3/uL (ref 4.0–10.5)
nRBC: 0 % (ref 0.0–0.2)

## 2022-04-18 LAB — BASIC METABOLIC PANEL
Anion gap: 7 (ref 5–15)
BUN: 8 mg/dL (ref 6–20)
CO2: 20 mmol/L — ABNORMAL LOW (ref 22–32)
Calcium: 8.4 mg/dL — ABNORMAL LOW (ref 8.9–10.3)
Chloride: 109 mmol/L (ref 98–111)
Creatinine, Ser: 0.96 mg/dL (ref 0.61–1.24)
GFR, Estimated: >60 mL/min (ref >60)
Glucose, Bld: 124 mg/dL — ABNORMAL HIGH (ref 70–99)
Potassium: 3.5 mmol/L (ref 3.5–5.1)
Sodium: 136 mmol/L (ref 135–145)

## 2022-04-18 LAB — SURGICAL PCR SCREEN
MRSA, PCR: NEGATIVE
Staphylococcus aureus: NEGATIVE

## 2022-04-18 SURGERY — IRRIGATION AND DEBRIDEMENT EXTREMITY
Anesthesia: General | Site: Foot | Laterality: Left

## 2022-04-18 MED ORDER — PROPOFOL 10 MG/ML IV BOLUS
INTRAVENOUS | Status: DC | PRN
  Administered 2022-04-18: 200 mg via INTRAVENOUS

## 2022-04-18 MED ORDER — BISACODYL 10 MG RE SUPP: 10.0000 mg | Freq: Every day | RECTAL | Status: DC | PRN

## 2022-04-18 MED ORDER — LIDOCAINE 2% (20 MG/ML) 5 ML SYRINGE
INTRAMUSCULAR | Status: AC
  Filled 2022-04-18: qty 5

## 2022-04-18 MED ORDER — ACETAMINOPHEN 325 MG PO TABS: 325.0000 mg | ORAL_TABLET | Freq: Four times a day (QID) | ORAL | Status: DC | PRN

## 2022-04-18 MED ORDER — SODIUM CHLORIDE 0.9 % IR SOLN
Status: DC | PRN
  Administered 2022-04-18: 3000 mL

## 2022-04-18 MED ORDER — MIDAZOLAM HCL 2 MG/2ML IJ SOLN
INTRAMUSCULAR | Status: AC
  Filled 2022-04-18: qty 2

## 2022-04-18 MED ORDER — DEXAMETHASONE SODIUM PHOSPHATE 10 MG/ML IJ SOLN
INTRAMUSCULAR | Status: DC | PRN
  Administered 2022-04-18: 4 mg via INTRAVENOUS

## 2022-04-18 MED ORDER — LIDOCAINE 2% (20 MG/ML) 5 ML SYRINGE
INTRAMUSCULAR | Status: DC | PRN
  Administered 2022-04-18: 100 mg via INTRAVENOUS

## 2022-04-18 MED ORDER — ONDANSETRON HCL 4 MG/2ML IJ SOLN
4.0000 mg | Freq: Once | INTRAMUSCULAR | Status: DC | PRN
Start: 2022-04-18 — End: 2022-04-18

## 2022-04-18 MED ORDER — MIDAZOLAM HCL 5 MG/5ML IJ SOLN
INTRAMUSCULAR | Status: DC | PRN
  Administered 2022-04-18: 2 mg via INTRAVENOUS

## 2022-04-18 MED ORDER — FENTANYL CITRATE (PF) 100 MCG/2ML IJ SOLN
INTRAMUSCULAR | Status: AC
  Filled 2022-04-18: qty 2

## 2022-04-18 MED ORDER — ONDANSETRON HCL 4 MG PO TABS: 4.0000 mg | ORAL_TABLET | Freq: Four times a day (QID) | ORAL | Status: DC | PRN

## 2022-04-18 MED ORDER — DOCUSATE SODIUM 100 MG PO CAPS
100.0000 mg | ORAL_CAPSULE | Freq: Two times a day (BID) | ORAL | Status: DC
  Administered 2022-04-18 – 2022-04-20 (×4): 100 mg via ORAL
  Filled 2022-04-18 (×5): qty 1

## 2022-04-18 MED ORDER — ROCURONIUM BROMIDE 10 MG/ML (PF) SYRINGE
PREFILLED_SYRINGE | INTRAVENOUS | Status: AC
  Filled 2022-04-18: qty 10

## 2022-04-18 MED ORDER — FENTANYL CITRATE (PF) 100 MCG/2ML IJ SOLN
INTRAMUSCULAR | Status: DC | PRN
  Administered 2022-04-18 (×2): 50 ug via INTRAVENOUS

## 2022-04-18 MED ORDER — ONDANSETRON HCL 4 MG/2ML IJ SOLN
INTRAMUSCULAR | Status: DC | PRN
  Administered 2022-04-18: 4 mg via INTRAVENOUS

## 2022-04-18 MED ORDER — POVIDONE-IODINE 10 % EX SWAB: 2.0000 "application " | Freq: Once | CUTANEOUS | Status: DC

## 2022-04-18 MED ORDER — SODIUM CHLORIDE 0.9 % IV SOLN: INTRAVENOUS | Status: DC

## 2022-04-18 MED ORDER — OXYCODONE HCL 5 MG/5ML PO SOLN: 5.0000 mg | Freq: Once | ORAL | Status: DC | PRN

## 2022-04-18 MED ORDER — FENTANYL CITRATE (PF) 100 MCG/2ML IJ SOLN: 25.0000 ug | INTRAMUSCULAR | Status: DC | PRN

## 2022-04-18 MED ORDER — CHLORHEXIDINE GLUCONATE 4 % EX LIQD
60.0000 mL | Freq: Once | CUTANEOUS | Status: AC
  Administered 2022-04-18: 4 via TOPICAL
  Filled 2022-04-18 (×2): qty 60

## 2022-04-18 MED ORDER — OXYCODONE HCL 5 MG PO TABS: 5.0000 mg | ORAL_TABLET | Freq: Once | ORAL | Status: DC | PRN

## 2022-04-18 MED ORDER — HYDROCODONE-ACETAMINOPHEN 5-325 MG PO TABS: 1.0000 | ORAL_TABLET | ORAL | Status: DC | PRN

## 2022-04-18 MED ORDER — HYDROCODONE-ACETAMINOPHEN 7.5-325 MG PO TABS
1.0000 | ORAL_TABLET | ORAL | Status: DC | PRN
  Administered 2022-04-18 – 2022-04-20 (×6): 2 via ORAL
  Filled 2022-04-18 (×6): qty 2

## 2022-04-18 MED ORDER — PROPOFOL 10 MG/ML IV BOLUS
INTRAVENOUS | Status: AC
  Filled 2022-04-18: qty 20

## 2022-04-18 MED ORDER — 0.9 % SODIUM CHLORIDE (POUR BTL) OPTIME
TOPICAL | Status: DC | PRN
  Administered 2022-04-18: 1000 mL

## 2022-04-18 MED ORDER — FENTANYL CITRATE (PF) 250 MCG/5ML IJ SOLN
INTRAMUSCULAR | Status: AC
  Filled 2022-04-18: qty 5

## 2022-04-18 MED ORDER — POLYETHYLENE GLYCOL 3350 17 G PO PACK: 17.0000 g | PACK | Freq: Every day | ORAL | Status: DC | PRN

## 2022-04-18 MED ORDER — LACTATED RINGERS IV SOLN: INTRAVENOUS | Status: DC | PRN

## 2022-04-18 MED ORDER — KETOROLAC TROMETHAMINE 30 MG/ML IJ SOLN: 30.0000 mg | Freq: Once | INTRAMUSCULAR | Status: DC | PRN

## 2022-04-18 MED ORDER — ONDANSETRON HCL 4 MG/2ML IJ SOLN: 4.0000 mg | Freq: Four times a day (QID) | INTRAMUSCULAR | Status: DC | PRN

## 2022-04-18 MED ORDER — ENSURE PRE-SURGERY PO LIQD
296.0000 mL | Freq: Once | ORAL | Status: AC
  Administered 2022-04-18: 296 mL via ORAL
  Filled 2022-04-18: qty 296

## 2022-04-18 SURGICAL SUPPLY — 36 items
BAG COUNTER SPONGE SURGICOUNT (BAG) IMPLANT
BAG SPNG CNTER NS LX DISP (BAG)
BLADE SURG 21 STRL SS (BLADE) ×3 IMPLANT
BNDG CMPR 5X6 CHSV STRCH STRL (GAUZE/BANDAGES/DRESSINGS) ×1
BNDG COHESIVE 6X5 TAN ST LF (GAUZE/BANDAGES/DRESSINGS) ×1 IMPLANT
BNDG COHESIVE 6X5 TAN STRL LF (GAUZE/BANDAGES/DRESSINGS) IMPLANT
CNTNR URN SCR LID CUP LEK RST (MISCELLANEOUS) IMPLANT
CONT SPEC 4OZ STRL OR WHT (MISCELLANEOUS) ×2
COVER SURGICAL LIGHT HANDLE (MISCELLANEOUS) ×5 IMPLANT
DRAPE U-SHAPE 47X51 STRL (DRAPES) ×3 IMPLANT
DRSG ADAPTIC 3X8 NADH LF (GAUZE/BANDAGES/DRESSINGS) ×3 IMPLANT
DRSG PAD ABDOMINAL 8X10 ST (GAUZE/BANDAGES/DRESSINGS) ×1 IMPLANT
DURAPREP 26ML APPLICATOR (WOUND CARE) ×3 IMPLANT
ELECT REM PT RETURN 9FT ADLT (ELECTROSURGICAL) ×2
ELECTRODE REM PT RTRN 9FT ADLT (ELECTROSURGICAL) IMPLANT
GAUZE SPONGE 4X4 12PLY STRL (GAUZE/BANDAGES/DRESSINGS) ×3 IMPLANT
GLOVE BIOGEL PI IND STRL 9 (GLOVE) ×2 IMPLANT
GLOVE BIOGEL PI INDICATOR 9 (GLOVE) ×1
GLOVE SURG ORTHO 9.0 STRL STRW (GLOVE) ×3 IMPLANT
GOWN STRL REUS W/ TWL XL LVL3 (GOWN DISPOSABLE) ×4 IMPLANT
GOWN STRL REUS W/TWL XL LVL3 (GOWN DISPOSABLE) ×4
HANDPIECE INTERPULSE COAX TIP (DISPOSABLE) ×2
KIT BASIN OR (CUSTOM PROCEDURE TRAY) ×3 IMPLANT
KIT TURNOVER KIT B (KITS) ×3 IMPLANT
MANIFOLD NEPTUNE II (INSTRUMENTS) ×3 IMPLANT
NS IRRIG 1000ML POUR BTL (IV SOLUTION) ×3 IMPLANT
PACK ORTHO EXTREMITY (CUSTOM PROCEDURE TRAY) ×3 IMPLANT
PAD ARMBOARD 7.5X6 YLW CONV (MISCELLANEOUS) ×4 IMPLANT
SET HNDPC FAN SPRY TIP SCT (DISPOSABLE) IMPLANT
STOCKINETTE IMPERVIOUS 9X36 MD (GAUZE/BANDAGES/DRESSINGS) ×1 IMPLANT
SUT ETHILON 2 0 PSLX (SUTURE) ×3 IMPLANT
SWAB COLLECTION DEVICE MRSA (MISCELLANEOUS) ×2 IMPLANT
SWAB CULTURE ESWAB REG 1ML (MISCELLANEOUS) IMPLANT
TOWEL GREEN STERILE (TOWEL DISPOSABLE) ×3 IMPLANT
TUBE CONNECTING 12X1/4 (SUCTIONS) ×3 IMPLANT
YANKAUER SUCT BULB TIP NO VENT (SUCTIONS) ×3 IMPLANT

## 2022-04-18 NOTE — Progress Notes (Signed)
Orthopedic Tech Progress Note Patient Details:  Albert Horne 1991/07/02 [id number]  Ortho Devices Type of Ortho Device: Postop shoe/boot Ortho Device/Splint Location: LLE Ortho Device/Splint Interventions: Ordered, Application, Adjustment   Post Interventions Patient Tolerated: Well Instructions Provided: Care of device  Donald Pore 04/18/2022, 11:24 AM

## 2022-04-18 NOTE — Transfer of Care (Signed)
Immediate Anesthesia Transfer of Care Note  Patient: Albert Horne  Procedure(s) Performed: IRRIGATION AND DEBRIDEMENT FOOT (Left: Foot)  Patient Location: PACU  Anesthesia Type:General  Level of Consciousness: drowsy and patient cooperative  Airway & Oxygen Therapy: Patient Spontanous Breathing and Patient connected to nasal cannula oxygen  Post-op Assessment: Report given to RN, Post -op Vital signs reviewed and stable and Patient moving all extremities  Post vital signs: Reviewed and stable  Last Vitals:  Vitals Value Taken Time  BP    Temp    Pulse    Resp 21 04/18/22 0826  SpO2    Vitals shown include unvalidated device data.  Last Pain:  Vitals:   04/18/22 0424  TempSrc: Oral  PainSc:          Complications: No notable events documented.

## 2022-04-18 NOTE — Evaluation (Addendum)
Physical Therapy Evaluation Patient Details Name: Albert Horne MRN: [id number] DOB: 1991-10-25 Today's Date: 04/18/2022  History of Present Illness  31 yo male admitted 5/27 with edema and erythema of left foot s/p I&D 5/29. PMhx: autism, cerebral palsy, obesity  Clinical Impression  Pt very pleasant and had spilled urinal on arrival and was eager to get OOB and cleaned up. Pt able to transition to EOB and walk to chair in room but denied further mobility due to pain. Pt with decreased activity tolerance, mobility and gait who will benefit from acute therapy to maximize mobility and safety. Pt with mod assist to don post op shoe and educated for wear and positioning.   95% on RA       Recommendations for follow up therapy are one component of a multi-disciplinary discharge planning process, led by the attending physician.  Recommendations may be updated based on patient status, additional functional criteria and insurance authorization.  Follow Up Recommendations No PT follow up    Assistance Recommended at Discharge Intermittent Supervision/Assistance  Patient can return home with the following  A little help with walking and/or transfers;A little help with bathing/dressing/bathroom;Assistance with cooking/housework;Assist for transportation;Direct supervision/assist for medications management;Direct supervision/assist for financial management    Equipment Recommendations Rolling walker (2 wheels)  Recommendations for Other Services       Functional Status Assessment Patient has had a recent decline in their functional status and demonstrates the ability to make significant improvements in function in a reasonable and predictable amount of time.     Precautions / Restrictions Precautions Precautions: Fall Required Braces or Orthoses: Other Brace Other Brace: post op shoe LLE Restrictions Weight Bearing Restrictions: Yes LLE Weight Bearing: Weight bearing as tolerated       Mobility  Bed Mobility Overal bed mobility: Modified Independent                  Transfers Overall transfer level: Needs assistance   Transfers: Sit to/from Stand Sit to Stand: Min guard           General transfer comment: cues for hand placement and safety    Ambulation/Gait Ambulation/Gait assistance: Min guard Gait Distance (Feet): 15 Feet Assistive device: Rolling walker (2 wheels) Gait Pattern/deviations: Step-through pattern, Decreased stride length   Gait velocity interpretation: 1.31 - 2.62 ft/sec, indicative of limited community ambulator   General Gait Details: cues to step into RW and for sequence with RW  Stairs            Wheelchair Mobility    Modified Rankin (Stroke Patients Only)       Balance Overall balance assessment: Needs assistance   Sitting balance-Leahy Scale: Good Sitting balance - Comments: static sitting and donning sock without LOB   Standing balance support: Bilateral upper extremity supported Standing balance-Leahy Scale: Poor Standing balance comment: bil UE use on RW with post op shoe                             Pertinent Vitals/Pain Pain Assessment Pain Assessment: 0-10 Pain Score: 5  Pain Location: left foot Pain Descriptors / Indicators: Aching Pain Intervention(s): Limited activity within patient's tolerance, Monitored during session, Repositioned    Home Living Family/patient expects to be discharged to:: Private residence Living Arrangements: Parent;Other relatives Available Help at Discharge: Family;Available 24 hours/day Type of Home: House Home Access: Level entry       Home Layout: One level Home Equipment: None  Prior Function Prior Level of Function : Independent/Modified Independent                     Hand Dominance        Extremity/Trunk Assessment   Upper Extremity Assessment Upper Extremity Assessment: Overall WFL for tasks assessed    Lower  Extremity Assessment Lower Extremity Assessment: Generalized weakness    Cervical / Trunk Assessment Cervical / Trunk Assessment: Kyphotic  Communication   Communication: No difficulties  Cognition Arousal/Alertness: Awake/alert Behavior During Therapy: Flat affect Overall Cognitive Status: No family/caregiver present to determine baseline cognitive functioning                                 General Comments: pt with difficulty stating his height, easily confused with questions regarding height of items at home        General Comments      Exercises     Assessment/Plan    PT Assessment Patient needs continued PT services  PT Problem List Decreased strength;Decreased mobility;Decreased activity tolerance;Decreased cognition;Decreased knowledge of use of DME       PT Treatment Interventions Gait training;Balance training;Functional mobility training;Therapeutic activities;Patient/family education;DME instruction    PT Goals (Current goals can be found in the Care Plan section)  Acute Rehab PT Goals Patient Stated Goal: return home PT Goal Formulation: With patient Time For Goal Achievement: 05/02/22 Potential to Achieve Goals: Good    Frequency Min 3X/week     Co-evaluation               AM-PAC PT "6 Clicks" Mobility  Outcome Measure Help needed turning from your back to your side while in a flat bed without using bedrails?: A Little Help needed moving from lying on your back to sitting on the side of a flat bed without using bedrails?: A Little Help needed moving to and from a bed to a chair (including a wheelchair)?: A Little Help needed standing up from a chair using your arms (e.g., wheelchair or bedside chair)?: A Little Help needed to walk in hospital room?: A Little Help needed climbing 3-5 steps with a railing? : A Lot 6 Click Score: 17    End of Session   Activity Tolerance: Patient tolerated treatment well Patient left: in  chair;with call bell/phone within reach;with chair alarm set Nurse Communication: Mobility status PT Visit Diagnosis: Other abnormalities of gait and mobility (R26.89);Pain Pain - Right/Left: Left Pain - part of body: Ankle and joints of foot    Time: 9518-8416 PT Time Calculation (min) (ACUTE ONLY): 18 min   Charges:   PT Evaluation $PT Eval Moderate Complexity: 1 Mod          Alpha Chouinard P, PT Acute Rehabilitation Services Pager: [phone] Office: [phone]   Enedina Finner Jizel Cheeks 04/18/2022, 12:20 PM

## 2022-04-18 NOTE — TOC Initial Note (Signed)
Transition of Care [facility]) - Initial/Assessment Note    Patient Details  Name: Albert Horne MRN: [id number] Date of Birth: 05-Mar-1991  [facility]) CM/SW Contact:    Kingsley Plan, RN Phone Number: 04/18/2022, 3:56 PM  Clinical Narrative:                 Spoke to patient at bedside. Patient face timed his mother . Discussed PT recommendations for no PT follow up but they do recommend a walker.   Patient needs PCP.   Patient's mother unsure if patient's insurance is active or not. She will call insurance company tomorrow due to today being a holiday.   NCM will follow up with patient and his mother tomorrow. If no insurance discussed [facility] . Will wait to order walker until tomorrow also   Expected Discharge Plan: Home/Self Care     Patient Goals and CMS Choice Patient states their goals for this hospitalization and ongoing recovery are:: to return to home CMS Medicare.gov Compare Post Acute Care list provided to:: Patient    Expected Discharge Plan and Services Expected Discharge Plan: Home/Self Care   Discharge Planning Services: CM Consult   Living arrangements for the past 2 months: Single Family Home                 DME Arranged: Walker rolling         HH Arranged: NA          Prior Living Arrangements/Services Living arrangements for the past 2 months: Single Family Home Lives with:: Parents Patient language and need for interpreter reviewed:: Yes Do you feel safe going back to the place where you live?: Yes      Need for Family Participation in Patient Care: Yes (Comment) Care giver support system in place?: Yes (comment)   Criminal Activity/Legal Involvement Pertinent to Current Situation/Hospitalization: No - Comment as needed  Activities of Daily Living Home Assistive Devices/Equipment: None ADL Screening (condition at time of admission) Patient's cognitive ability adequate to safely complete daily  activities?: Yes Is the patient deaf or have difficulty hearing?: No Does the patient have difficulty seeing, even when wearing glasses/contacts?: No Does the patient have difficulty concentrating, remembering, or making decisions?: No Patient able to express need for assistance with ADLs?: Yes Does the patient have difficulty dressing or bathing?: Yes Independently performs ADLs?: Yes (appropriate for developmental age) Does the patient have difficulty walking or climbing stairs?: No Weakness of Legs: None Weakness of Arms/Hands: None  Permission Sought/Granted   Permission granted to share information with : Yes, Verbal Permission Granted  Share Information with NAME: mother Gage Weant           Emotional Assessment Appearance:: Appears stated age Attitude/Demeanor/Rapport: Engaged Affect (typically observed): Accepting Orientation: : Oriented to Self, Oriented to Place, Oriented to  Time, Oriented to Situation Alcohol / Substance Use: Not Applicable Psych Involvement: No (comment)  Admission diagnosis:  Osteomyelitis (HCC) [M86.9] Cellulitis of left foot [L03.116] Patient Active Problem List   Diagnosis Date Noted   Cutaneous abscess of left foot    Cellulitis of left foot    Osteomyelitis (HCC) 04/16/2022   Leukocytosis 04/16/2022   PCP:  Patient, [name] Pharmacy:   [facility] [address] [place] - [id number] [place] [address] Phone: [phone] Fax: [phone]     Social Determinants of Health (SDOH) Interventions    Readmission Risk Interventions  View : No data to display.

## 2022-04-18 NOTE — Progress Notes (Signed)
PROGRESS NOTE    Albert FesterJustice A Horne  WUJ:[id number] DOB: 12-Oct-1991 DOA: 04/16/2022 PCP: Patient, [name]   Brief Narrative:   Albert Horne is a 31 y.o. male with medical history significant of autism and obesity presents to the ED with a chief complaint of left foot pain swelling and redness for several days.  Upon arrival to ED: Patient afebrile, vital signs stable.  CBC shows leukocytosis of 13.1.  A1c: 4.8.  HIV negative.  Normal sodium, potassium and liver enzymes and kidney function.  X-ray of left foot shows soft tissue swelling of the dorsum of the foot and first toe with first toe ulceration.  Cannot exclude osteomyelitis.  Patient started on vancomycin and cefepime.  Ortho consulted who recommended [facility] and further evaluation. MRI left foot shows osteomyelitis cannot be excluded.  Fluid collection consistent with abscesses on the dorsum of the foot which is larger and at the level of metatarsal measuring at least 1.3 x 5.7 x 2.2 cm in size and small proximal over the first metatarsal phalangeal joint measuring 1.0 x 2.4 x 1.7 cm.  Marked the skin thickening and subcutaneous soft tissue edema consistent with severe cellulitis.  Assessment & Plan:  Abscess of left foot: Cellulitis of left foot: -S/p irrigation and debridement of left foot on 5/29 by Dr. Lajoyce Cornersuda -Hemoglobin A1c: 4.8.  HIV negative -Continue as needed pain medications.   -Continue IV fluids.  Patient remained afebrile and leukocytosis has resolved. -Continue Rocephin and vancomycin and adjust antibiotics based on culture results. -PT recommended no PT follow-up  Morbid obesity with BMI of 50: Diet modification, exercise and weight loss recommended  History of autism: - at baseline  DVT prophylaxis: SCD, heparin Code Status: Full code Family Communication:  None present at bedside.  Plan of care discussed with patient in length and he verbalized understanding and  agreed with it. Disposition Plan: Home  Consultants:  Orthopedic surgery  Procedures:  Irrigation and debridement  Antimicrobials:  Rocephin Vancomycin  Status is: Inpatient   Subjective: Patient seen and examined.  Sitting comfortably on recliner and eating lunch.  Reports achiness in his left foot.  No fever, chills, headache, chest pain or shortness of breath.  Objective: Vitals:   04/18/22 0832 04/18/22 0837 04/18/22 1000 04/18/22 1211  BP: (!) 107/49  129/73   Pulse: (!) 57  72   Resp: 17  17   Temp:   98.7 F (37.1 C)   TempSrc:   Axillary   SpO2: 94% 95%  96%  Weight:      Height:        Intake/Output Summary (Last 24 hours) at 04/18/2022 1330 Last data filed at 04/18/2022 1011 Gross per 24 hour  Intake 2546.61 ml  Output 1025 ml  Net 1521.61 ml    Filed Weights   04/16/22 2037  Weight: (!) 142.9 kg    Examination:  General exam: Appears calm and comfortable, on room air, communicating well, obese, eating lunch Respiratory system: Clear to auscultation. Respiratory effort normal. Cardiovascular system: S1 & S2 heard, RRR. No JVD, murmurs, rubs, gallops or clicks. No pedal edema. Gastrointestinal system: Abdomen is nondistended, soft and nontender. No organomegaly or masses felt. Normal bowel sounds heard. Central nervous system: Alert and oriented. No focal neurological deficits. Left foot: Dressing dry and intact.in postop boot Psychiatry: Judgement and insight appear normal. Mood & affect appropriate.    Data Reviewed: I have personally reviewed following labs and  imaging studies  CBC: Recent Labs  Lab 04/16/22 2118 04/17/22 0108 04/18/22 0048  WBC 13.1* 13.2* 8.8  NEUTROABS 9.3* 8.7* 5.1  HGB 15.6 13.7 13.0  HCT 46.3 40.8 39.7  MCV 92.6 91.9 92.5  PLT 294 297 281    Basic Metabolic Panel: Recent Labs  Lab 04/16/22 2118 04/17/22 0108 04/18/22 0048  NA 139 140 136  K 3.8 4.0 3.5  CL 109 108 109  CO2 23 26 20*  GLUCOSE 104* 106*  124*  BUN 10 8 8   CREATININE 0.97 1.03 0.96  CALCIUM 8.9 8.9 8.4*  MG  --  2.0  --     GFR: Estimated Creatinine Clearance: 151.8 mL/min (by C-G formula based on SCr of 0.96 mg/dL). Liver Function Tests: Recent Labs  Lab 04/16/22 2118 04/17/22 0108  AST 19 14*  ALT 19 18  ALKPHOS 68 54  BILITOT 0.3 0.8  PROT 7.8 6.9  ALBUMIN 4.3 3.5    No results for input(s): LIPASE, AMYLASE in the last 168 hours. No results for input(s): AMMONIA in the last 168 hours. Coagulation Profile: No results for input(s): INR, PROTIME in the last 168 hours. Cardiac Enzymes: No results for input(s): CKTOTAL, CKMB, CKMBINDEX, TROPONINI in the last 168 hours. BNP (last 3 results) No results for input(s): PROBNP in the last 8760 hours. HbA1C: Recent Labs    04/17/22 0108  HGBA1C 4.8    CBG: No results for input(s): GLUCAP in the last 168 hours. Lipid Profile: No results for input(s): CHOL, HDL, LDLCALC, TRIG, CHOLHDL, LDLDIRECT in the last 72 hours. Thyroid Function Tests: No results for input(s): TSH, T4TOTAL, FREET4, T3FREE, THYROIDAB in the last 72 hours. Anemia Panel: No results for input(s): VITAMINB12, FOLATE, FERRITIN, TIBC, IRON, RETICCTPCT in the last 72 hours. Sepsis Labs: No results for input(s): PROCALCITON, LATICACIDVEN in the last 168 hours.  Recent Results (from the past 240 hour(s))  Surgical pcr screen     Status: None   Collection Time: 04/18/22  4:27 AM   Specimen: Nasal Mucosa; Nasal Swab  Result Value Ref Range Status   MRSA, PCR NEGATIVE NEGATIVE Final   Staphylococcus aureus NEGATIVE NEGATIVE Final    Comment: (NOTE) The Xpert SA Assay (FDA approved for NASAL specimens in patients 67 years of age and older), is one component of a comprehensive surveillance program. It is not intended to diagnose infection nor to guide or monitor treatment. Performed at [facility], [address], [place], [place]   Aerobic/Anaerobic Culture w Gram Stain  (surgical/deep wound)     Status: None (Preliminary result)   Collection Time: 04/18/22  8:03 AM   Specimen: Soft Tissue, Other  Result Value Ref Range Status   Specimen Description TISSUE LEFT FOOT  Final   Special Requests PT ON ROCEPHIN  Final   Gram Stain   Final    NO WBC SEEN NO ORGANISMS SEEN Performed at [facility], [address], [place], [place]    Culture PENDING  Incomplete   Report Status PENDING  Incomplete      Radiology Studies: MR FOOT LEFT W WO CONTRAST  Result Date: 04/17/2022 CLINICAL DATA:  Foot swelling, nondiabetic. Osteomyelitis suspected. EXAM: MRI OF THE LEFT FOREFOOT WITHOUT AND WITH CONTRAST TECHNIQUE: Multiplanar, multisequence MR imaging of the left was performed both before and after administration of intravenous contrast. CONTRAST:  81mL GADAVIST GADOBUTROL 1 MMOL/ML IV SOLN COMPARISON:  None Available. FINDINGS: Bones/Joint/Cartilage There is bone marrow edema of  the partially imaged navicular bone. There are however osteophytes and osseous fragmentations at the talonavicular joint suggesting chronic arthropathy. Subchondral cystic changes at the first metatarsophalangeal joint. Ligaments Collateral ligaments are intact.  Lisfranc ligament is intact. Muscles and Tendons Flexor, peroneal and extensor compartment tendons are intact. Increased intrasubstance signal of the plantar muscles suggesting myopathy/myositis. No fluid collection or abscess. Soft tissue There are multiple peripherally enhancing subcutaneous fluid collection on dorsum of the foot, there is a distal collection at the level of the metatarsal shafts measuring at least 1.3 x 5.7 x 2.2 cm. There is another proximal fluid collection measuring approximately 1.0 x 2.4 x 1.7 cm at the level of the first tarsometatarsal joint. No soft tissue mass. Marked skin thickening and subcutaneous soft tissue edema consistent with cellulitis. IMPRESSION: 1. Bone marrow edema of the navicular bone  which is partially imaged on this examination. This edema could be reactive secondary to chronic ongoing osteomyelitis or sequela of recent trauma. In the absence of adjacent ongoing infectious/inflammatory process with two abscesses on dorsum of the foot, osteomyelitis can not be completely excluded. 2. Peripherally enhancing fluid collections consistent with abscesses on dorsum of the foot, the distal which is larger and at the level of the all metatarsal shafts measuring at least 1.3 x 5.7 x 2.2 cm and a small proximal over the first metatarsophalangeal joint measuring at least 1.0 x 2.4 x 1.7 cm. 3. No appreciable deep skin ulcer identified on this examination, clinical correlation is suggested. Marked skin thickening and subcutaneous soft tissue edema consistent with severe cellulitis. Electronically Signed   By: Larose Hires D.O.   On: 04/17/2022 12:01   DG Foot Complete Left  Result Date: 04/16/2022 CLINICAL DATA:  Pain and swelling. EXAM: LEFT FOOT - COMPLETE 3+ VIEW COMPARISON:  Left foot x-ray 02/09/2018. FINDINGS: There is soft tissue swelling of the dorsum of the foot and of the first toe. Ulceration is seen overlying the medial aspect of the first toe. There is no acute fracture or dislocation. There are new minimal areas of cortical irregularity over the dorsal aspect of the navicular bone seen on the lateral view. Chronic changes of the dorsal talus or similar to the prior study. IMPRESSION: 1. Soft tissue swelling of the dorsum of the foot and first toe with first toe ulceration. 2. Cortical irregularity dorsal aspect of the navicular bone. Can not exclude osteomyelitis in the appropriate clinical setting. Electronically Signed   By: Darliss Cheney M.D.   On: 04/16/2022 22:11    Scheduled Meds:  docusate sodium  100 mg Oral BID   heparin  5,000 Units Subcutaneous Q8H   Continuous Infusions:  sodium chloride Stopped (04/18/22 0721)   sodium chloride Stopped (04/18/22 1010)   cefTRIAXone  (ROCEPHIN)  IV 200 mL/hr at 04/18/22 1011   vancomycin 1,500 mg (04/18/22 0420)     LOS: 2 days   Time spent: 35 minutes   Naomee Nowland Estill Cotta, MD [facility]  If 7PM-7AM, please contact night-coverage [email or web address] 04/18/2022, 1:30 PM

## 2022-04-18 NOTE — Anesthesia Preprocedure Evaluation (Addendum)
Anesthesia Evaluation  Patient identified by MRN, date of birth, ID band Patient awake    Reviewed: Allergy & Precautions, NPO status , Patient's Chart, lab work & pertinent test results  Airway Mallampati: II  TM Distance: >3 FB Neck ROM: Full    Dental no notable dental hx.    Pulmonary neg pulmonary ROS,    breath sounds clear to auscultation + decreased breath sounds      Cardiovascular negative cardio ROS Normal cardiovascular exam Rhythm:Regular Rate:Normal     Neuro/Psych CP  Neuromuscular disease negative psych ROS   GI/Hepatic negative GI ROS, Neg liver ROS,   Endo/Other  Morbid obesity  Renal/GU negative Renal ROS  negative genitourinary   Musculoskeletal negative musculoskeletal ROS (+)   Abdominal (+) + obese,   Peds negative pediatric ROS (+)  Hematology negative hematology ROS (+)   Anesthesia Other Findings   Reproductive/Obstetrics negative OB ROS                             Anesthesia Physical Anesthesia Plan  ASA: 3 and emergent  Anesthesia Plan: General   Post-op Pain Management: Minimal or no pain anticipated   Induction: Intravenous  PONV Risk Score and Plan: 2 and Ondansetron and Treatment may vary due to age or medical condition  Airway Management Planned: LMA  Additional Equipment:   Intra-op Plan:   Post-operative Plan: Extubation in OR  Informed Consent: I have reviewed the patients History and Physical, chart, labs and discussed the procedure including the risks, benefits and alternatives for the proposed anesthesia with the patient or authorized representative who has indicated his/her understanding and acceptance.     Dental advisory given  Plan Discussed with: CRNA and Surgeon  Anesthesia Plan Comments:        Anesthesia Quick Evaluation

## 2022-04-18 NOTE — Anesthesia Procedure Notes (Signed)
Procedure Name: LMA Insertion Date/Time: 04/18/2022 7:47 AM Performed by: Shireen Quan, CRNA Pre-anesthesia Checklist: Patient identified, Emergency Drugs available, Suction available and Patient being monitored Patient Re-evaluated:Patient Re-evaluated prior to induction Oxygen Delivery Method: Circle System Utilized Preoxygenation: Pre-oxygenation with 100% oxygen Induction Type: IV induction Ventilation: Mask ventilation without difficulty LMA: LMA inserted LMA Size: 5.0 Number of attempts: 1 Placement Confirmation: positive ETCO2 Tube secured with: Tape Dental Injury: Teeth and Oropharynx as per pre-operative assessment

## 2022-04-18 NOTE — Progress Notes (Signed)
Telephone consent obtained from Patient's mother for his procedure witnessed by Vernia Buff, RN

## 2022-04-18 NOTE — Interval H&P Note (Signed)
History and Physical Interval Note:  04/18/2022 7:37 AM  Albert Horne  has presented today for surgery, with the diagnosis of ABSCESS LEFT FOOT.  The various methods of treatment have been discussed with the patient and family. After consideration of risks, benefits and other options for treatment, the patient has consented to  Procedure(s): IRRIGATION AND DEBRIDEMENT FOOT (Left) as a surgical intervention.  The patient's history has been reviewed, patient examined, no change in status, stable for surgery.  I have reviewed the patient's chart and labs.  Questions were answered to the patient's satisfaction.     [name]

## 2022-04-18 NOTE — Op Note (Signed)
04/18/2022  8:20 AM  PATIENT:  Albert Horne    PRE-OPERATIVE DIAGNOSIS:  ABSCESS LEFT FOOT  POST-OPERATIVE DIAGNOSIS:  Same  PROCEDURE:  IRRIGATION AND DEBRIDEMENT FOOT  SURGEON:  [name], MD  PHYSICIAN ASSISTANT:None ANESTHESIA:   General  PREOPERATIVE INDICATIONS:  [name] is a  31 y.o. male with a diagnosis of ABSCESS LEFT FOOT who failed conservative measures and elected for surgical management.    The risks benefits and alternatives were discussed with the patient preoperatively including but not limited to the risks of infection, bleeding, nerve injury, cardiopulmonary complications, the need for revision surgery, among others, and the patient was willing to proceed.  OPERATIVE IMPLANTS: None  @ENCIMAGES @  OPERATIVE FINDINGS: Patient had extensive area of fluid and inflammatory tissue.  Tissue and fluid sent for cultures.  No frank purulence.  No gouty changes.  No signs of chronic osteomyelitis.  OPERATIVE PROCEDURE: Patient brought the operating room and underwent a general anesthetic.  After adequate levels anesthesia were obtained patient's left lower extremity was prepped using DuraPrep draped into a sterile field a timeout was called.  A dorsal incision was made over the second metatarsal.  There was a large fluid collection that was clear.  There was inflamed tissue that was also sent for cultures.  Multiple compartments of the foot were debrided no evidence of osteomyelitis.  No tophaceous deposits.  After debridement the wound was irrigated with pulsatile lavage the wound was closed using 2-0 nylon a sterile dressing was applied patient was extubated taken the PACU in stable condition.  Debridement type: Excisional Debridement  Side: left  Body Location: foot   Tools used for debridement: scalpel and rongeur  Pre-debridement Wound size (cm):   Length: 0        Width: 0     Depth: 0   Post-debridement Wound size (cm):   Length: 9        Width:  5     Depth: 1   Debridement depth beyond dead/damaged tissue down to healthy viable tissue: yes  Tissue layer involved: skin, subcutaneous tissue, muscle / fascia  Nature of tissue removed: Devitalized Tissue and Non-viable tissue  Irrigation volume: 1 liter     Irrigation fluid type: Normal Saline     DISCHARGE PLANNING:  Antibiotic duration: Continue antibiotics and adjust for oral antibiotics when cultures finalized  Weightbearing: Weightbearing as tolerated  Pain medication: Opioid pathway  Dressing care/ Wound VAC: Reinforce dressing as needed  Ambulatory devices: Walker or crutches  Discharge to: Home  Follow-up: In the office 1 week post operative.

## 2022-04-18 NOTE — Anesthesia Postprocedure Evaluation (Signed)
Anesthesia Post Note  Patient: Albert Horne  Procedure(s) Performed: IRRIGATION AND DEBRIDEMENT FOOT (Left: Foot)     Patient location during evaluation: PACU Anesthesia Type: General Level of consciousness: awake and alert Pain management: pain level controlled Vital Signs Assessment: post-procedure vital signs reviewed and stable Respiratory status: spontaneous breathing, nonlabored ventilation, respiratory function stable and patient connected to nasal cannula oxygen Cardiovascular status: blood pressure returned to baseline and stable Postop Assessment: no apparent nausea or vomiting Anesthetic complications: no   No notable events documented.  Last Vitals:  Vitals:   04/18/22 0837 04/18/22 1000  BP:  129/73  Pulse:  72  Resp:  17  Temp:  37.1 C  SpO2: 95%     Last Pain:  Vitals:   04/18/22 1000  TempSrc: Axillary  PainSc:                  [name]

## 2022-04-19 ENCOUNTER — Encounter (HOSPITAL_COMMUNITY): Payer: Self-pay | Admitting: Orthopedic Surgery

## 2022-04-19 LAB — CBC WITH DIFFERENTIAL/PLATELET
Abs Immature Granulocytes: 0.06 10*3/uL (ref 0.00–0.07)
Basophils Absolute: 0 10*3/uL (ref 0.0–0.1)
Basophils Relative: 0 %
Eosinophils Absolute: 0 10*3/uL (ref 0.0–0.5)
Eosinophils Relative: 0 %
HCT: 39.7 % (ref 39.0–52.0)
Hemoglobin: 13.7 g/dL (ref 13.0–17.0)
Immature Granulocytes: 0 %
Lymphocytes Relative: 15 %
Lymphs Abs: 2 10*3/uL (ref 0.7–4.0)
MCH: 31.4 pg (ref 26.0–34.0)
MCHC: 34.5 g/dL (ref 30.0–36.0)
MCV: 91.1 fL (ref 80.0–100.0)
Monocytes Absolute: 1.3 10*3/uL — ABNORMAL HIGH (ref 0.1–1.0)
Monocytes Relative: 10 %
Neutro Abs: 10.1 10*3/uL — ABNORMAL HIGH (ref 1.7–7.7)
Neutrophils Relative %: 75 %
Platelets: 324 10*3/uL (ref 150–400)
RBC: 4.36 MIL/uL (ref 4.22–5.81)
RDW: 12.4 % (ref 11.5–15.5)
WBC: 13.5 10*3/uL — ABNORMAL HIGH (ref 4.0–10.5)
nRBC: 0 % (ref 0.0–0.2)

## 2022-04-19 LAB — BASIC METABOLIC PANEL
Anion gap: 6 (ref 5–15)
BUN: 6 mg/dL (ref 6–20)
CO2: 23 mmol/L (ref 22–32)
Calcium: 8.8 mg/dL — ABNORMAL LOW (ref 8.9–10.3)
Chloride: 109 mmol/L (ref 98–111)
Creatinine, Ser: 1 mg/dL (ref 0.61–1.24)
GFR, Estimated: >60 mL/min (ref >60)
Glucose, Bld: 121 mg/dL — ABNORMAL HIGH (ref 70–99)
Potassium: 3.9 mmol/L (ref 3.5–5.1)
Sodium: 138 mmol/L (ref 135–145)

## 2022-04-19 MED ORDER — SODIUM CHLORIDE 0.9 % IV SOLN: 1.0000 g | INTRAVENOUS | Status: DC

## 2022-04-19 MED ORDER — SODIUM CHLORIDE 0.9 % IV SOLN
1.0000 g | INTRAVENOUS | Status: DC
  Administered 2022-04-19 – 2022-04-20 (×2): 1 g via INTRAVENOUS
  Filled 2022-04-19 (×2): qty 10

## 2022-04-19 NOTE — TOC Progression Note (Signed)
Transition of Care [facility]) - Progression Note    Patient Details  Name: Albert Horne MRN: [id number] Date of Birth: [birth year]  [facility] CM/SW Contact  Nadene Rubins Adria Devon, RN Phone Number: 04/19/2022, 11:34 AM  Clinical Narrative:     Followed up with patient. Unsure about insurance his mother is Engineer, structural.   [facility] in [place]. Called for walker. Placed free Clinic of  information on AVS.     Expected Discharge Plan: Home/Self Care    Expected Discharge Plan and Services Expected Discharge Plan: Home/Self Care   Discharge Planning Services: CM Consult   Living arrangements for the past 2 months: Single Family Home                 DME Arranged: Walker rolling         HH Arranged: NA           Social Determinants of Health (SDOH) Interventions    Readmission Risk Interventions     View : No data to display.

## 2022-04-19 NOTE — Progress Notes (Signed)
PROGRESS NOTE    Albert Horne  XBM:[id number] DOB: [birth year] DOA: 04/16/2022 PCP: Patient, [name]   Brief Narrative:   Albert Horne is a 31 y.o. male with medical history significant of autism and obesity presents to the ED with a chief complaint of left foot pain swelling and redness for several days.  Upon arrival to ED: Patient afebrile, vital signs stable.  CBC shows leukocytosis of 13.1.  A1c: 4.8.  HIV negative.  Normal sodium, potassium and liver enzymes and kidney function.  X-ray of left foot shows soft tissue swelling of the dorsum of the foot and first toe with first toe ulceration.  Cannot exclude osteomyelitis.  Patient started on vancomycin and cefepime.  Ortho consulted who recommended transfer patient to [facility] for MRI and further evaluation. MRI left foot shows osteomyelitis cannot be excluded.  Fluid collection consistent with abscesses on the dorsum of the foot which is larger and at the level of metatarsal measuring at least 1.3 x 5.7 x 2.2 cm in size and small proximal over the first metatarsal phalangeal joint measuring 1.0 x 2.4 x 1.7 cm.  Marked the skin thickening and subcutaneous soft tissue edema consistent with severe cellulitis.  Assessment & Plan:  Abscess of left foot: Cellulitis of left foot: -S/p irrigation and debridement of left foot on 5/29 by Dr. Lajoyce Corners -Hemoglobin A1c: 4.8.  HIV negative -Continue as needed pain medications.   -Off IV fluids, only KVO.   Patient remained afebrile and leukocytosis has resolved. -Continue Rocephin and vancomycin  -Awaiting final culture data for d/c antibiotics -PT recommended no PT follow-up  Morbid obesity with BMI of 50: Diet modification, exercise and weight loss recommended  History of autism: - at baseline  DVT prophylaxis: SCD, heparin Code Status: Full code Family Communication:  None present at bedside.  Plan of care discussed with patient and he verbalized understanding and  agreement.  Disposition Plan: Home  Consultants:  Orthopedic surgery  Procedures:  Irrigation and debridement  Antimicrobials:  Rocephin Vancomycin  Status is: Inpatient   Subjective: Patient seen and examined.  Sitting comfortably on recliner and eating lunch.  Reports achiness in his left foot.  No fever, chills, headache, chest pain or shortness of breath.  Objective: Vitals:   04/18/22 1933 04/18/22 2330 04/19/22 0441 04/19/22 0820  BP: 121/72 120/80 111/86 135/70  Pulse: 78 76 65 71  Resp: 20 18 18 17   Temp: 98.2 F (36.8 C) 98.2 F (36.8 C) 98.7 F (37.1 C) 98 F (36.7 C)  TempSrc: Oral Oral Oral Oral  SpO2: 95% 97% 96% 99%  Weight:      Height:        Intake/Output Summary (Last 24 hours) at 04/19/2022 1334 Last data filed at 04/19/2022 0900 Gross per 24 hour  Intake 650.6 ml  Output 650 ml  Net 0.6 ml   Filed Weights   04/16/22 2037  Weight: (!) 142.9 kg    Examination:  General exam: awake, alert, no acute distress, obese HEENT: moist mucus membranes, hearing grossly normal  Respiratory system: CTAB, no wheezes, rales or rhonchi, normal respiratory effort. Cardiovascular system: normal S1/S2, RRR, no pedal edema.   Gastrointestinal system: soft, NT Central nervous system: A&O x3. no gross focal neurologic deficits, normal speech Extremities: L foot with clean dry intact dressing, no BLE edema, no edema Skin: dry, intact, normal temperature Psychiatry: normal mood, congruent affect=    Data Reviewed: I have personally reviewed following labs and imaging  studies  Notable labs today:  glucose 121, Ca 8.8, WBC 13.5k     Recent Results (from the past 240 hour(s))  Surgical pcr screen     Status: None   Collection Time: 04/18/22  4:27 AM   Specimen: Nasal Mucosa; Nasal Swab  Result Value Ref Range Status   MRSA, PCR NEGATIVE NEGATIVE Final   Staphylococcus aureus NEGATIVE NEGATIVE Final    Comment: (NOTE) The Xpert SA Assay (FDA approved  for NASAL specimens in patients 65 years of age and older), is one component of a comprehensive surveillance program. It is not intended to diagnose infection nor to guide or monitor treatment. Performed at [facility], [address], [place], [place] [address]   Aerobic/Anaerobic Culture w Gram Stain (surgical/deep wound)     Status: None (Preliminary result)   Collection Time: 04/18/22  8:03 AM   Specimen: Soft Tissue, Other  Result Value Ref Range Status   Specimen Description TISSUE LEFT FOOT  Final   Special Requests PT ON ROCEPHIN  Final   Gram Stain NO WBC SEEN NO ORGANISMS SEEN   Final   Culture   Final    FEW STAPHYLOCOCCUS LUGDUNENSIS SUSCEPTIBILITIES TO FOLLOW Performed at [facility], [address] [place], [place] [address]    Report Status PENDING  Incomplete      Radiology Studies: None to review today    LOS: 3 days   Time spent: 35 minutes   Pennie Banter, [facility]  If 7PM-7AM, please contact night-coverage [email or web address] 04/19/2022, 1:34 PM

## 2022-04-19 NOTE — Progress Notes (Addendum)
Physical Therapy Treatment Patient Details Name: Albert Horne MRN: [id number] DOB: [birth year] Today's Date: 04/19/2022   History of Present Illness 31 yo male admitted 5/27 with edema and erythema of left foot s/p I&D 5/29. PMhx: autism, cerebral palsy, obesity    PT Comments    Pt was seen for progression of mobility on RW with cast shoe L foot.  Pt is demonstrating ability to maneuver but with confined spaces tends to drop walker to move with HHA on furniture.  Talked with him about keeping walker with him, and to make sure he is steady before releasing walker.  Likely will need SPC before too long depending on the healing process on LLE.  Follow acutely for goals of PT as outlined in POC.   Recommendations for follow up therapy are one component of a multi-disciplinary discharge planning process, led by the attending physician.  Recommendations may be updated based on patient status, additional functional criteria and insurance authorization.  Follow Up Recommendations  No PT follow up     Assistance Recommended at Discharge Set up Supervision/Assistance  Patient can return home with the following A little help with walking and/or transfers;A little help with bathing/dressing/bathroom;Assist for transportation;Assistance with cooking/housework   Equipment Recommendations  Rolling walker (2 wheels)    Recommendations for Other Services       Precautions / Restrictions Precautions Precautions: Fall Required Braces or Orthoses: Other Brace Other Brace: post op shoe LLE Restrictions Weight Bearing Restrictions: Yes LLE Weight Bearing: Weight bearing as tolerated     Mobility  Bed Mobility Overal bed mobility: Modified Independent                  Transfers Overall transfer level: Needs assistance Equipment used: Rolling walker (2 wheels) Transfers: Sit to/from Stand Sit to Stand: Min guard           General transfer comment: reminders for sequence and  safety    Ambulation/Gait Ambulation/Gait assistance: Min guard Gait Distance (Feet): 60 Feet Assistive device: Rolling walker (2 wheels) Gait Pattern/deviations: Step-through pattern, Decreased stride length Gait velocity: reduced Gait velocity interpretation: <1.31 ft/sec, indicative of household ambulator Pre-gait activities: standing balance ck General Gait Details: reminders about turning and not dropping walker before getting to bed   Stairs             Wheelchair Mobility    Modified Rankin (Stroke Patients Only)       Balance Overall balance assessment: Needs assistance   Sitting balance-Leahy Scale: Good       Standing balance-Leahy Scale: Fair Standing balance comment: requires some steadying on RW when walking                            Cognition Arousal/Alertness: Awake/alert Behavior During Therapy: Flat affect Overall Cognitive Status: No family/caregiver present to determine baseline cognitive functioning                                 General Comments: able to answer questions but struggling with some details, such as reporting a family member currently working for [employer] Comments General comments (skin integrity, edema, etc.): pt is up to move with RW for some security but can statically stand with fair balance      Pertinent Vitals/Pain Pain Assessment Pain Assessment:  Faces Faces Pain Scale: Hurts little more Pain Location: left foot Pain Descriptors / Indicators: Operative site guarding, Sore Pain Intervention(s): Monitored during session, Repositioned, Patient requesting pain meds-RN notified    Home Living                          Prior Function            PT Goals (current goals can now be found in the care plan section) Acute Rehab PT Goals Patient Stated Goal: return home Progress towards PT goals: Progressing toward goals    Frequency    Min  3X/week      PT Plan Current plan remains appropriate    Co-evaluation              AM-PAC PT "6 Clicks" Mobility   Outcome Measure  Help needed turning from your back to your side while in a flat bed without using bedrails?: A Little Help needed moving from lying on your back to sitting on the side of a flat bed without using bedrails?: A Little Help needed moving to and from a bed to a chair (including a wheelchair)?: A Little Help needed standing up from a chair using your arms (e.g., wheelchair or bedside chair)?: A Little Help needed to walk in hospital room?: A Little Help needed climbing 3-5 steps with a railing? : A Little 6 Click Score: 18    End of Session Equipment Utilized During Treatment: Gait belt Activity Tolerance: Patient tolerated treatment well Patient left: in bed;with call bell/phone within reach;with bed alarm set Nurse Communication: Mobility status PT Visit Diagnosis: Other abnormalities of gait and mobility (R26.89);Pain Pain - Right/Left: Left Pain - part of body: Ankle and joints of foot     Time: [id number] PT Time Calculation (min) (ACUTE ONLY): 18 min  Charges:  $Gait Training: 8-22 mins   Ramond Dial 04/19/2022, 2:38 PM  Mee Hives, PT PhD Acute Rehab Dept. Number: [facility] [facility] [place]

## 2022-04-19 NOTE — Progress Notes (Signed)
Patient ID: Albert Horne, male   DOB: 12-11-1990, 31 y.o.   MRN: [id number] Patient is postoperative day 1 debridement abscess left foot.  Patient has no complaints this morning the dressing is clean and dry.  Cultures are pending.  Anticipate patient could discharge to home on oral doxycycline 100 mg twice a day I will adjust this according to culture sensitivities.  I will follow-up in the office in 1 week.

## 2022-04-19 NOTE — Plan of Care (Signed)
  Problem: Nutrition: Goal: Adequate nutrition will be maintained Outcome: Progressing   Problem: Pain Managment: Goal: General experience of comfort will improve Outcome: Progressing   

## 2022-04-20 ENCOUNTER — Other Ambulatory Visit: Payer: Self-pay

## 2022-04-20 ENCOUNTER — Encounter (HOSPITAL_COMMUNITY): Payer: Self-pay

## 2022-04-20 ENCOUNTER — Emergency Department (HOSPITAL_COMMUNITY)
Admission: EM | Admit: 2022-04-20 | Discharge: 2022-04-20 | Disposition: A | Discharge status: Home / Self Care | Payer: Medicaid - Out of State | Attending: Emergency Medicine | Admitting: Emergency Medicine

## 2022-04-20 DIAGNOSIS — L7622 Postprocedural hemorrhage and hematoma of skin and subcutaneous tissue following other procedure: Secondary | ICD-10-CM | POA: Insufficient documentation

## 2022-04-20 DIAGNOSIS — Z6841 Body Mass Index (BMI) 40.0 and over, adult: Secondary | ICD-10-CM

## 2022-04-20 DIAGNOSIS — F840 Autistic disorder: Secondary | ICD-10-CM | POA: Diagnosis not present

## 2022-04-20 DIAGNOSIS — L089 Local infection of the skin and subcutaneous tissue, unspecified: Secondary | ICD-10-CM

## 2022-04-20 DIAGNOSIS — T148XXA Other injury of unspecified body region, initial encounter: Secondary | ICD-10-CM

## 2022-04-20 LAB — CBC
HCT: 40.1 % (ref 39.0–52.0)
Hemoglobin: 13.4 g/dL (ref 13.0–17.0)
MCH: 31 pg (ref 26.0–34.0)
MCHC: 33.4 g/dL (ref 30.0–36.0)
MCV: 92.8 fL (ref 80.0–100.0)
Platelets: 306 10*3/uL (ref 150–400)
RBC: 4.32 MIL/uL (ref 4.22–5.81)
RDW: 12.5 % (ref 11.5–15.5)
WBC: 8.7 10*3/uL (ref 4.0–10.5)
nRBC: 0 % (ref 0.0–0.2)

## 2022-04-20 MED ORDER — CEFADROXIL 500 MG PO CAPS: 500.0000 mg | ORAL_CAPSULE | Freq: Two times a day (BID) | ORAL | 0 refills | Status: AC

## 2022-04-20 MED ORDER — OXYCODONE HCL 5 MG PO TABS: 5.0000 mg | ORAL_TABLET | Freq: Four times a day (QID) | ORAL | 0 refills | Status: DC | PRN

## 2022-04-20 NOTE — Discharge Instructions (Signed)
Get help right away if: You have a red streak of skin near the area around your wound. Pus or a bad smell coming from the wound. Your wound has been closed with staples, sutures, skin glue, or adhesive strips and it begins to open up and separate. Your wound is bleeding, and the bleeding does not stop with gentle pressure. 

## 2022-04-20 NOTE — ED Notes (Signed)
Patient left before discharge instructions reviewed. Assumed that pt ambulated out to lobby to d/c

## 2022-04-20 NOTE — ED Triage Notes (Signed)
Pt has discharged from [facility] today for a foot injury. Once pt got home pt stated that his foot started bleeding and now patient wants to be seen. Pt still has boot on and has not looked at site. Pt denies any other symptoms other than foot bleeding. No bleeding noted at this time.

## 2022-04-20 NOTE — ED Notes (Signed)
Unable to get VS before pt left

## 2022-04-20 NOTE — Assessment & Plan Note (Signed)
noted 

## 2022-04-20 NOTE — Hospital Course (Signed)
Albert Horne is Albert Horne 31 y.o. male with medical history significant of autism and obesity presents to the ED with Gwenetta Devos chief complaint of left foot pain swelling and redness for several days.  He had MRI findings concerning for left foot infection with multiple abscesses, cellulitis, and possible osteo.  He was seen by orthopedics, now s/p I&D.  Plan for discharge on oral abx with outpatient ortho follow up.  See below for additional details

## 2022-04-20 NOTE — Discharge Summary (Signed)
Physician Discharge Summary  [name] GHW:[id number] DOB: [birth year] DOA: 04/16/2022  PCP: Patient, [name]  Admit date: 04/16/2022 Discharge date: 04/20/2022  Time spent: 40 minutes  Recommendations for Outpatient Follow-up:  Follow up with Dr. 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 male with medical history significant of autism and obesity presents to the ED with Albert Horne chief complaint of left foot pain swelling and redness for several days.  He had MRI findings concerning for left foot infection with multiple abscesses, cellulitis, and possible osteo.  He was seen by orthopedics, now s/p I&D.  Plan for discharge on oral abx with outpatient ortho follow up.  See below for additional details  Hospital Course:  Assessment and Plan: * Cellulitis - MRI L foot with peripherally enhancing fluid collections c/w abscess on dorsum of the foot, marked skin thickening and subcutaneous soft tissue edema c/w severe cellulitis.  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 Cardiovascular: RRR Lungs: unlabored Abdomen: Soft, nontender, nondistended  Neurological: Alert and oriented 3. Moves all extremities 4 with equal strength. Cranial nerves II through XII grossly intact. Extremities: dressing to LLE  Discharge Instructions   Discharge Instructions     Call MD for:  difficulty breathing, headache or visual disturbances   Complete by: As directed    Call MD for:  extreme fatigue   Complete by: As directed    Call MD for:  hives   Complete by: As directed    Call MD for:  persistant dizziness or light-headedness   Complete by: As directed    Call MD for:  persistant nausea and vomiting   Complete by: As directed    Call MD for:  redness, tenderness, or signs of infection (pain, swelling, redness, odor or green/yellow discharge around incision site)   Complete by: As directed    Call MD for:  severe uncontrolled pain   Complete by: As directed    Call MD for:  temperature >100.4  Complete by: As directed    Diet - low sodium heart healthy   Complete by: As directed    Discharge instructions   Complete by: As directed    You were seen for Albert Horne foot infection.    This was treated surgically by orthopedics.  They performed an irrigation and debridement of the left foot.  Orthopedics is recommending weightbearing as tolerated with [name] walker or crutches as needed.    You should follow up with Dr. Sharol Given in about 1 week.    Return for new, recurrent, or  worsening symptoms.  Please ask your PCP to request records from this hospitalization so they know what was done and what the next steps will be.   Discharge wound care:   Complete by: As directed    Reinforce dressing as need, otherwise, per Dr. Sharol Given   Increase activity slowly   Complete by: As directed       Allergies as of 04/20/2022   No Known Allergies      Medication List     TAKE these medications    cefadroxil 500 MG capsule Commonly known as: DURICEF Take 1 capsule (500 mg total) by mouth 2 (two) times daily for 7 days.   oxyCODONE 5 MG immediate release tablet Commonly known as: Roxicodone Take 1 tablet (5 mg total) by mouth every 6 (six) hours as needed for up to 5 days.               Durable Medical Equipment  (From admission, onward)           Start     Ordered   04/20/22 1333  For home use only DME Walker rolling  Once       Question Answer Comment  Walker: With [place] Wheels   Patient needs [name] walker to treat with the following condition Physical deconditioning      04/20/22 1332   04/19/22 1129  For home use only DME Walker rolling  Once       Question Answer Comment  Walker: With [place]   Patient needs [name] walker to treat with the following condition Weakness      04/19/22 1129              Discharge Care Instructions  (From admission, onward)           Start     Ordered   04/20/22 0000  Discharge wound care:       Comments: Reinforce dressing as need, otherwise, per Dr. Sharol Given   04/20/22 1338           No Known Allergies  Follow-up Information     ANNIE [place]. Go to .   Specialty: Emergency Medicine Why: If symptoms worsen Contact information: [place] [phone] [address] [place]. Schedule an appointment as soon as possible for [name] visit.   Contact information: [place]        [name], MD Follow up in 1 week(s).   Specialty: Orthopedic Surgery Contact information: [place] [address] [phone]                  The results of significant diagnostics from this hospitalization (including imaging, microbiology, ancillary and laboratory) are listed below for reference.    Significant Diagnostic Studies: MR FOOT  LEFT W WO CONTRAST  Result Date: 04/17/2022 CLINICAL DATA:  Foot swelling, nondiabetic. Osteomyelitis suspected. EXAM: MRI OF THE LEFT FOREFOOT WITHOUT AND WITH CONTRAST TECHNIQUE: Multiplanar, multisequence MR imaging of the left was performed both before and after administration of intravenous contrast. CONTRAST:  60m GADAVIST GADOBUTROL 1 MMOL/ML IV SOLN COMPARISON:  None Available. FINDINGS: Bones/Joint/Cartilage There is bone marrow edema of the partially imaged navicular bone. There are however osteophytes and osseous fragmentations at the talonavicular joint suggesting chronic arthropathy. Subchondral cystic changes at the first metatarsophalangeal joint. Ligaments Collateral ligaments are intact.  Lisfranc ligament is intact. Muscles and Tendons Flexor, peroneal and extensor compartment tendons are intact. Increased intrasubstance signal of the plantar muscles suggesting myopathy/myositis. No fluid collection or abscess. Soft tissue There are multiple peripherally enhancing subcutaneous fluid collection on dorsum of the foot, there is Pierra Skora distal collection at the level of the metatarsal shafts measuring at least 1.3 x 5.7 x 2.2 cm. There is another proximal fluid collection measuring approximately 1.0 x 2.4 x 1.7 cm at the level of the first tarsometatarsal joint. No soft tissue mass. Marked skin thickening and subcutaneous soft tissue edema consistent with cellulitis. IMPRESSION: 1. Bone marrow edema of the navicular bone which is partially imaged on this examination. This edema could be  reactive secondary to chronic ongoing osteomyelitis or sequela of recent trauma. In the absence of adjacent ongoing infectious/inflammatory process with two abscesses on dorsum of the foot, osteomyelitis can not be completely excluded. 2. Peripherally enhancing fluid collections consistent with abscesses on dorsum of the foot, the distal which is larger and at the level of the all metatarsal shafts measuring at least 1.3 x 5.7 x 2.2 cm and Khaila Velarde small proximal over the first metatarsophalangeal joint measuring at least 1.0 x 2.4 x 1.7 cm. 3. No appreciable deep skin ulcer identified on this examination, clinical correlation is suggested. Marked skin thickening and subcutaneous soft tissue edema consistent with severe cellulitis. Electronically Signed   By: IKeane PoliceD.O.   On: 04/17/2022 12:01   DG Foot Complete Left  Result Date: 04/16/2022 CLINICAL DATA:  Pain and swelling. EXAM: LEFT FOOT - COMPLETE 3+ VIEW COMPARISON:  Left foot x-ray 02/09/2018. FINDINGS: There is soft tissue swelling of the dorsum of the foot and of the first toe. Ulceration is seen overlying the medial aspect of the first toe. There is no acute fracture or dislocation. There are new minimal areas of cortical irregularity over the dorsal aspect of the navicular bone seen on the lateral view. Chronic changes of the dorsal talus or similar to the prior study. IMPRESSION: 1. Soft tissue swelling of the dorsum of the foot and first toe with first toe ulceration. 2. Cortical irregularity dorsal aspect of the navicular bone. Can not exclude osteomyelitis in the appropriate clinical setting. Electronically Signed   By: ARonney AstersM.D.   On: 04/16/2022 22:11    Microbiology: Recent Results (from the past 240 hour(s))  Surgical pcr screen     Status: None   Collection Time: 04/18/22  4:27 AM   Specimen: Nasal Mucosa; Nasal Swab  Result Value Ref Range Status   MRSA, PCR NEGATIVE NEGATIVE Final   Staphylococcus aureus NEGATIVE NEGATIVE  Final    Comment: (NOTE) The Xpert SA Assay (FDA approved for NASAL specimens in patients 236years of age and older), is one component of [facility] [place] comprehensive surveillance program. It is not intended to diagnose infection nor to guide or monitor treatment. Performed at [facility], [address] [place], [place] [address]   Aerobic/Anaerobic Culture w Gram Stain (surgical/deep wound)     Status: None (Preliminary result)   Collection Time: 04/18/22  8:03 AM   Specimen: Soft Tissue, Other  Result Value Ref Range Status   Specimen Description TISSUE LEFT FOOT  Final   Special Requests PT ON ROCEPHIN  Final   Gram Stain   Final    NO WBC SEEN NO ORGANISMS SEEN Performed at [facility], [place] [address], [place], [place] [address]    Culture FEW STAPHYLOCOCCUS LUGDUNENSIS  Final   Report Status PENDING  Incomplete   Organism ID, Bacteria STAPHYLOCOCCUS LUGDUNENSIS  Final      Susceptibility   Staphylococcus lugdunensis - MIC*    CIPROFLOXACIN <=0.5 SENSITIVE Sensitive     ERYTHROMYCIN <=0.25 SENSITIVE Sensitive     GENTAMICIN <=0.5 SENSITIVE Sensitive     OXACILLIN 2 SENSITIVE Sensitive     TETRACYCLINE <=1 SENSITIVE Sensitive     VANCOMYCIN 1 SENSITIVE Sensitive     TRIMETH/SULFA <=10 SENSITIVE Sensitive     CLINDAMYCIN <=0.25 SENSITIVE Sensitive     RIFAMPIN <=0.5 SENSITIVE Sensitive     Inducible Clindamycin NEGATIVE Sensitive     * FEW STAPHYLOCOCCUS LUGDUNENSIS     Labs: Basic Metabolic Panel: Recent Labs  Lab 04/16/22 2118 04/17/22 0108 04/18/22 0048 04/19/22 0204  NA 139 140 136 138  K 3.8 4.0 3.5 3.9  CL 109 108 109 109  CO2 23 26 20* 23  GLUCOSE 104* 106* 124* 121*  BUN 10 8 8 6   CREATININE 0.97 1.03 0.96 1.00  CALCIUM 8.9 8.9 8.4* 8.8*  MG  --  2.0  --   --    Liver Function Tests: Recent Labs  Lab 04/16/22 2118 04/17/22 0108  AST 19 14*  ALT 19 18  ALKPHOS 68 54  BILITOT 0.3 0.8  PROT 7.8 6.9  ALBUMIN 4.3 3.5   No results for  input(s): LIPASE, AMYLASE in the last 168 hours. No results for input(s): AMMONIA in the last 168 hours. CBC: Recent Labs  Lab 04/16/22 2118 04/17/22 0108 04/18/22 0048 04/19/22 0204 04/20/22 0138  WBC 13.1* 13.2* 8.8 13.5* 8.7  NEUTROABS 9.3* 8.7* 5.1 10.1*  --   HGB 15.6 13.7 13.0 13.7 13.4  HCT 46.3 40.8 39.7 39.7 40.1  MCV 92.6 91.9 92.5 91.1 92.8  PLT 294 297 281 324 306   Cardiac Enzymes: No results for input(s): CKTOTAL, CKMB, CKMBINDEX, TROPONINI in the last 168 hours. BNP: BNP (last 3 results) No results for input(s): BNP in the last 8760 hours.  ProBNP (last 3 results) No results for input(s): PROBNP in the last 8760 hours.  CBG: No results for input(s): GLUCAP in the last 168 hours.     Signed:  Fayrene Helper MD.  [facility] 04/20/2022, 1:59 PM

## 2022-04-20 NOTE — ED Provider Notes (Incomplete)
  [facility] EMERGENCY DEPARTMENT Provider Note   CSN: [id number] Arrival date & time: 04/20/22  1910     History {Add pertinent medical, surgical, social history, OB history to HPI:1} No chief complaint on file.   Kieffer A Posten is a 31 y.o. male with a past medical history of cerebral palsy, autism, morbid obesity, who presents for evaluation of bleeding from his left foot.  Patient was discharged after recent hospitalization from [facility] 2-day.  He was admitted for cellulitis and cutaneous abscess of his left foot.  He underwent I&D and has concern for osteomyelitis.  He was given antibiotics and is supposed to follow-up with Dr. Lajoyce Corners.  Patient has bleeding from the wound is currently wearing a boot and came in for further evaluation.  HPI     Home Medications Prior to Admission medications   Medication Sig Start Date End Date Taking? Authorizing Provider  cefadroxil (DURICEF) 500 MG capsule Take 1 capsule (500 mg total) by mouth 2 (two) times daily for 7 days. 04/20/22 04/27/22  Zigmund Daniel., MD  oxyCODONE (ROXICODONE) 5 MG immediate release tablet Take 1 tablet (5 mg total) by mouth every 6 (six) hours as needed for up to 5 days. 04/20/22 04/25/22  Zigmund Daniel., MD      Allergies    Patient has no known allergies.    Review of Systems   Review of Systems  Physical Exam Updated Vital Signs BP 115/83 (BP Location: Right Arm)   Pulse 62   Temp 97.8 F (36.6 C) (Oral)   Resp 17   Ht 5\' 7"  (1.702 m)   Wt 136.1 kg   SpO2 97%   BMI 46.99 kg/m  Physical Exam  ED Results / Procedures / Treatments   Labs (all labs ordered are listed, but only abnormal results are displayed) Labs Reviewed - No data to display  EKG None  Radiology No results found.  Procedures Procedures  {Document cardiac monitor, telemetry assessment procedure when appropriate:1}  Medications Ordered in ED Medications - No data to display  ED Course/ Medical Decision  Making/ A&P                           Medical Decision Making  ***  {Document critical care time when appropriate:1} {Document review of labs and clinical decision tools ie heart score, Chads2Vasc2 etc:1}  {Document your independent review of radiology images, and any outside records:1} {Document your discussion with family members, caretakers, and with consultants:1} {Document social determinants of health affecting pt's care:1} {Document your decision making why or why not admission, treatments were needed:1} Final Clinical Impression(s) / ED Diagnoses Final diagnoses:  None    Rx / DC Orders ED Discharge Orders     None

## 2022-04-20 NOTE — Progress Notes (Signed)
Discharge instructions provided to patient, patient verbalizes understanding. Also reviewed with patients mother. She also verbalizes understanding. IV removed, belongings gathered. Patient equipment provided to patient. Patient discharged

## 2022-04-20 NOTE — ED Provider Notes (Signed)
[facility] EMERGENCY DEPARTMENT Provider Note   CSN: [id number] Arrival date & time: 04/20/22  1910     History  No chief complaint on file.   Albert Horne is a 31 y.o. male with a past medical history of cerebral palsy, autism, morbid obesity, who presents for evaluation of bleeding from his left foot.  Patient was discharged after recent hospitalization from [facility] 2-day.  He was admitted for cellulitis and cutaneous abscess of his left foot.  He underwent I&D and has concern for osteomyelitis.  He was given antibiotics and is supposed to follow-up with Dr. Lajoyce Corners.  Patient has bleeding from the wound is currently wearing a boot and came in for further evaluation.  HPI     Home Medications Prior to Admission medications   Medication Sig Start Date End Date Taking? Authorizing Provider  cefadroxil (DURICEF) 500 MG capsule Take 1 capsule (500 mg total) by mouth 2 (two) times daily for 7 days. 04/20/22 04/27/22  Zigmund Daniel., MD  oxyCODONE (ROXICODONE) 5 MG immediate release tablet Take 1 tablet (5 mg total) by mouth every 6 (six) hours as needed for up to 5 days. 04/20/22 04/25/22  Zigmund Daniel., MD      Allergies    Patient has no known allergies.    Review of Systems   Review of Systems  Physical Exam Updated Vital Signs BP 115/83 (BP Location: Right Arm)   Pulse 62   Temp 97.8 F (36.6 C) (Oral)   Resp 17   Ht 5\' 7"  (1.702 m)   Wt 136.1 kg   SpO2 97%   BMI 46.99 kg/m  Physical Exam Vitals and nursing note reviewed.  Constitutional:      General: He is not in acute distress.    Appearance: He is well-developed. He is not diaphoretic.  HENT:     Head: Normocephalic and atraumatic.  Eyes:     General: No scleral icterus.    Conjunctiva/sclera: Conjunctivae normal.  Cardiovascular:     Rate and Rhythm: Normal rate and regular rhythm.     Heart sounds: Normal heart sounds.  Pulmonary:     Effort: Pulmonary effort is normal. No  respiratory distress.     Breath sounds: Normal breath sounds.  Abdominal:     Palpations: Abdomen is soft.     Tenderness: There is no abdominal tenderness.  Musculoskeletal:     Cervical back: Normal range of motion and neck supple.  Feet:     Comments: Patient has a well-healing midline surgical scar of the dorsum of the left foot.  There is no active bleeding.  Mild serosanguineous oozing when pressure is applied.   Skin:    General: Skin is warm and dry.  Neurological:     Mental Status: He is alert.  Psychiatric:        Behavior: Behavior normal.    ED Results / Procedures / Treatments   Labs (all labs ordered are listed, but only abnormal results are displayed) Labs Reviewed - No data to display  EKG None  Radiology No results found.  Procedures Procedures    Medications Ordered in ED Medications - No data to display  ED Course/ Medical Decision Making/ A&P                           Medical Decision Making 31 year old male with recent left foot I&D by Dr. 26, discharged from the hospital today.  Patient's mother was concerned because she noticed blood on the dressing.  On evaluation of the wound there is no evidence of infection or active extravasation of blood.  There is mild serosanguineous oozing when pressure is applied and likely secondary to dependency and gravity in the upright position.  Patient's mother was reassured.  Patient denies any significant pain.  He is close outpatient follow-up with Dr. Lajoyce Corners.  I discussed wound care reasons to remove or redress the wound and reasons to seek immediate medical care in the emergency department.  He appears otherwise appropriate for discharge at this time            Final Clinical Impression(s) / ED Diagnoses Final diagnoses:  None    Rx / DC Orders ED Discharge Orders     None         Arthor Captain, PA-C 04/21/22 0009    Linwood Dibbles, MD 04/21/22 1650

## 2022-04-20 NOTE — Progress Notes (Signed)
Mobility Specialist Progress Note:   04/20/22 1000  Mobility  Activity Ambulated with assistance in hallway  Level of Assistance Contact guard assist, steadying assist  Assistive Device Front wheel walker  LLE Weight Bearing WBAT  Distance Ambulated (ft) 40 ft  Activity Response Tolerated well  $Mobility charge 1 Mobility   Pt eager for mobility session. Required up to minG with gait. Pt c/o 6/10 pain at end of session. Back in bed with all needs met, bed alarm on.   Nelta Numbers Acute Rehab Secure Chat or Office Phone: [phone]

## 2022-04-20 NOTE — Plan of Care (Signed)

## 2022-04-23 LAB — AEROBIC/ANAEROBIC CULTURE W GRAM STAIN (SURGICAL/DEEP WOUND): Gram Stain: NONE SEEN

## 2022-04-25 ENCOUNTER — Ambulatory Visit (INDEPENDENT_AMBULATORY_CARE_PROVIDER_SITE_OTHER): Payer: Self-pay | Admitting: Orthopedic Surgery

## 2022-04-25 DIAGNOSIS — L02612 Cutaneous abscess of left foot: Secondary | ICD-10-CM

## 2022-04-25 MED ORDER — OXYCODONE HCL 5 MG PO TABS: 5.0000 mg | ORAL_TABLET | Freq: Four times a day (QID) | ORAL | 0 refills | Status: AC | PRN

## 2022-04-25 MED ORDER — DOXYCYCLINE HYCLATE 100 MG PO TABS: 100.0000 mg | ORAL_TABLET | Freq: Two times a day (BID) | ORAL | 0 refills | Status: AC

## 2022-04-26 ENCOUNTER — Encounter: Payer: Self-pay | Admitting: Orthopedic Surgery

## 2022-04-26 NOTE — Progress Notes (Signed)
Office Visit Note   Patient: Albert Horne           Date of Birth: 04-06-1991           MRN: [id number] Visit Date: 04/25/2022              Requested by: No referring provider defined for this encounter. PCP: Patient, [name]  Chief Complaint  Patient presents with   Left Foot - Routine Post Op    04/18/22 I&D left foot      HPI: Patient is a 31 year old gentleman who presents 1 week status post debridement large abscess dorsum of the left foot.  Assessment & Plan: Visit Diagnoses:  1. Cutaneous abscess of left foot     Plan: A prescription for doxycycline and Percocet is called in.  Patient will start Dial soap cleansing and dry dressing changes daily discussed the importance of nonweightbearing and elevation.  Follow-Up Instructions: Return in about 1 week (around 05/02/2022).   Ortho Exam  Patient is alert, oriented, no adenopathy, well-dressed, normal affect, normal respiratory effort. Examination patient is currently full weightbearing.  There is slight wound dehiscence and maceration of the dorsal incision.  Patient states he is unable to use his mobility device.  Patient is currently full weightbearing in a postoperative shoe.  Cultures are reviewed which shows staph that is pansensitive.  Sensitive to doxycycline less than equal to 1.  Imaging: No results found. No images are attached to the encounter.  Labs: Lab Results  Component Value Date   HGBA1C 4.8 04/17/2022   ESRSEDRATE 16 04/16/2022   CRP 3.1 (H) 04/16/2022   REPTSTATUS 04/23/2022 FINAL 04/18/2022   GRAMSTAIN NO WBC SEEN NO ORGANISMS SEEN  04/18/2022   CULT  04/18/2022    FEW STAPHYLOCOCCUS LUGDUNENSIS NO ANAEROBES ISOLATED Performed at [facility], [address] [place], [place] [address]    [facility] STAPHYLOCOCCUS LUGDUNENSIS 04/18/2022     Lab Results  Component Value Date   ALBUMIN 3.5 04/17/2022   ALBUMIN 4.3 04/16/2022   PREALBUMIN 20.6 04/17/2022     Lab Results  Component Value Date   MG 2.0 04/17/2022   No results found for: [facility]  Lab Results  Component Value Date   PREALBUMIN 20.6 04/17/2022      Latest Ref Rng & Units 04/20/2022    1:38 AM 04/19/2022    2:04 AM 04/18/2022   12:48 AM  CBC EXTENDED  WBC 4.0 - 10.5 K/uL 8.7   13.5   8.8    RBC 4.22 - 5.81 MIL/uL 4.32   4.36   4.29    Hemoglobin 13.0 - 17.0 g/dL 58.0   99.8   33.8    HCT 39.0 - 52.0 % 40.1   39.7   39.7    Platelets 150 - 400 K/uL 306   324   281    NEUT# 1.7 - 7.7 K/uL  10.1   5.1    Lymph# 0.7 - 4.0 K/uL  2.0   2.4       There is no height or weight on file to calculate BMI.  Orders:  No orders of the defined types were placed in this encounter.  Meds ordered this encounter  Medications   doxycycline (VIBRA-TABS) 100 MG tablet    Sig: Take 1 tablet (100 mg total) by mouth 2 (two) times daily.    Dispense:  60 tablet    Refill:  0   oxyCODONE (ROXICODONE) 5 MG  immediate release tablet    Sig: Take 1 tablet (5 mg total) by mouth every 6 (six) hours as needed for up to 5 days.    Dispense:  20 tablet    Refill:  0     Procedures: No procedures performed  Clinical Data: No additional findings.  ROS:  All other systems negative, except as noted in the HPI. Review of Systems  Objective: Vital Signs: There were no vitals taken for this visit.  Specialty Comments:  No specialty comments available.  PMFS History: Patient Active Problem List   Diagnosis Date Noted   Left foot infection 04/20/2022   BMI 50.0-59.9, adult (HCC) 04/20/2022   Obesity, morbid, BMI 50 or higher (HCC) 04/20/2022   Cutaneous abscess of left foot    Cellulitis of left foot    Cellulitis 04/16/2022   Leukocytosis 04/16/2022   Past Medical History:  Diagnosis Date   Autism    Cerebral palsy (HCC)     History reviewed. No pertinent family history.  Past Surgical History:  Procedure Laterality Date   FOOT CAPSULE RELEASE W/ PERCUTANEOUS HEEL CORD  LENGTHENING, TIBIAL TENDON TRANSFER Bilateral    I & D EXTREMITY Left 04/18/2022   Procedure: IRRIGATION AND DEBRIDEMENT FOOT;  Surgeon: Nadara Mustard, MD;  Location: [facility] OR;  Service: Orthopedics;  Laterality: Left;   Social History   Occupational History   Not on file  Tobacco Use   Smoking status: Never   Smokeless tobacco: Never  Substance and Sexual Activity   Alcohol use: Never   Drug use: Never   Sexual activity: Not on file

## 2022-05-02 ENCOUNTER — Encounter: Payer: Self-pay | Admitting: Orthopedic Surgery

## 2022-05-02 ENCOUNTER — Ambulatory Visit (INDEPENDENT_AMBULATORY_CARE_PROVIDER_SITE_OTHER): Payer: Self-pay | Admitting: Orthopedic Surgery

## 2022-05-02 DIAGNOSIS — L02612 Cutaneous abscess of left foot: Secondary | ICD-10-CM

## 2022-05-02 MED ORDER — OXYCODONE-ACETAMINOPHEN 5-325 MG PO TABS: 1.0000 | ORAL_TABLET | Freq: Three times a day (TID) | ORAL | 0 refills | Status: DC | PRN

## 2022-05-02 NOTE — Progress Notes (Unsigned)
Office Visit Note   Patient: Albert Horne           Date of Birth: 12/10/1990           MRN: [id number] Visit Date: 05/02/2022              Requested by: No referring provider defined for this encounter. PCP: Patient, [name]  Chief Complaint  Patient presents with   Left Foot - Routine Post Op    04/18/2022 I&D left foot       HPI: Patient is a 31 year old gentleman who is seen in follow-up status post irrigation debridement abscess left foot.  Patient has been having a resolving hematoma.  He is currently on doxycycline.  Assessment & Plan: Visit Diagnoses:  1. Cutaneous abscess of left foot     Plan: Continue Dial soap cleansing daily dry dressing change daily continue nonweightbearing left foot.  Continue doxycycline prescription called in for Percocet  Follow-Up Instructions: Return in about 1 week (around 05/09/2022).   Ortho Exam  Patient is alert, oriented, no adenopathy, well-dressed, normal affect, normal respiratory effort. Examination patient does have increased swelling of the left foot with slight wound dehiscence.  The large retained hematoma was expressed through the sutures.  There is no cellulitis no purulence no signs of infection.  Recommended patient rent a wheelchair to facilitate ambulation outside the home.  Imaging: No results found. No images are attached to the encounter.  Labs: Lab Results  Component Value Date   HGBA1C 4.8 04/17/2022   ESRSEDRATE 16 04/16/2022   CRP 3.1 (H) 04/16/2022   REPTSTATUS 04/23/2022 FINAL 04/18/2022   GRAMSTAIN NO WBC SEEN NO ORGANISMS SEEN  04/18/2022   CULT  04/18/2022    FEW STAPHYLOCOCCUS LUGDUNENSIS NO ANAEROBES ISOLATED Performed at [facility], [address], [place], [place] [address]    [facility] STAPHYLOCOCCUS LUGDUNENSIS 04/18/2022     Lab Results  Component Value Date   ALBUMIN 3.5 04/17/2022   ALBUMIN 4.3 04/16/2022   PREALBUMIN 20.6 04/17/2022    Lab Results   Component Value Date   MG 2.0 04/17/2022   No results found for: "VD25OH"  Lab Results  Component Value Date   PREALBUMIN 20.6 04/17/2022      Latest Ref Rng & Units 04/20/2022    1:38 AM 04/19/2022    2:04 AM 04/18/2022   12:48 AM  CBC EXTENDED  WBC 4.0 - 10.5 K/uL 8.7  13.5  8.8   RBC 4.22 - 5.81 MIL/uL 4.32  4.36  4.29   Hemoglobin 13.0 - 17.0 g/dL 34.7  42.5  95.6   HCT 39.0 - 52.0 % 40.1  39.7  39.7   Platelets 150 - 400 K/uL 306  324  281   NEUT# 1.7 - 7.7 K/uL  10.1  5.1   Lymph# 0.7 - 4.0 K/uL  2.0  2.4      There is no height or weight on file to calculate BMI.  Orders:  No orders of the defined types were placed in this encounter.  Meds ordered this encounter  Medications   oxyCODONE-acetaminophen (PERCOCET/ROXICET) 5-325 MG tablet    Sig: Take 1 tablet by mouth every 8 (eight) hours as needed for severe pain.    Dispense:  20 tablet    Refill:  0     Procedures: No procedures performed  Clinical Data: No additional findings.  ROS:  All other systems negative, except as noted in the HPI. Review  of Systems  Objective: Vital Signs: There were no vitals taken for this visit.  Specialty Comments:  No specialty comments available.  PMFS History: Patient Active Problem List   Diagnosis Date Noted   Left foot infection 04/20/2022   BMI 50.0-59.9, adult (HCC) 04/20/2022   Obesity, morbid, BMI 50 or higher (HCC) 04/20/2022   Cutaneous abscess of left foot    Cellulitis of left foot    Cellulitis 04/16/2022   Leukocytosis 04/16/2022   Past Medical History:  Diagnosis Date   Autism    Cerebral palsy (HCC)     History reviewed. No pertinent family history.  Past Surgical History:  Procedure Laterality Date   FOOT CAPSULE RELEASE W/ PERCUTANEOUS HEEL CORD LENGTHENING, TIBIAL TENDON TRANSFER Bilateral    I & D EXTREMITY Left 04/18/2022   Procedure: IRRIGATION AND DEBRIDEMENT FOOT;  Surgeon: Nadara Mustard, MD;  Location: [facility] OR;  Service:  Orthopedics;  Laterality: Left;   Social History   Occupational History   Not on file  Tobacco Use   Smoking status: Never   Smokeless tobacco: Never  Substance and Sexual Activity   Alcohol use: Never   Drug use: Never   Sexual activity: Not on file

## 2022-05-09 ENCOUNTER — Ambulatory Visit (INDEPENDENT_AMBULATORY_CARE_PROVIDER_SITE_OTHER): Payer: Medicaid - Out of State | Admitting: Orthopedic Surgery

## 2022-05-09 ENCOUNTER — Encounter: Payer: Self-pay | Admitting: Orthopedic Surgery

## 2022-05-09 DIAGNOSIS — L02612 Cutaneous abscess of left foot: Secondary | ICD-10-CM

## 2022-05-09 MED ORDER — OXYCODONE-ACETAMINOPHEN 5-325 MG PO TABS: 1.0000 | ORAL_TABLET | Freq: Three times a day (TID) | ORAL | 0 refills | Status: DC | PRN

## 2022-05-09 NOTE — Progress Notes (Signed)
Office Visit Note   Patient: Albert Horne           Date of Birth: April 23, 1991           MRN: [id number] Visit Date: 05/09/2022              Requested by: No referring provider defined for this encounter. PCP: Patient, No Pcp Per  Chief Complaint  Patient presents with   Left Foot - Routine Post Op    04/18/2022 I&D left foot       HPI: Patient is a 31 year old gentleman status post debridement abscess left foot.  Patient is over 2 weeks out from surgery he is on doxycycline nonweightbearing.  Assessment & Plan: Visit Diagnoses:  1. Cutaneous abscess of left foot     Plan: Recommended elevation nonweightbearing Dial soap cleansing dry dressing change daily.  Follow-Up Instructions: Return in about 2 weeks (around 05/23/2022).   Ortho Exam  Patient is alert, oriented, no adenopathy, well-dressed, normal affect, normal respiratory effort. Examination there is maceration and swelling around the incision there is wound dehiscence there is no depth to the wound.  The skin is macerated  Imaging: No results found. No images are attached to the encounter.  Labs: Lab Results  Component Value Date   HGBA1C 4.8 04/17/2022   ESRSEDRATE 16 04/16/2022   CRP 3.1 (H) 04/16/2022   REPTSTATUS 04/23/2022 FINAL 04/18/2022   GRAMSTAIN NO WBC SEEN NO ORGANISMS SEEN  04/18/2022   CULT  04/18/2022    FEW STAPHYLOCOCCUS LUGDUNENSIS NO ANAEROBES ISOLATED Performed at [facility], [address] [place], [place] [address]    [facility] STAPHYLOCOCCUS LUGDUNENSIS 04/18/2022     Lab Results  Component Value Date   ALBUMIN 3.5 04/17/2022   ALBUMIN 4.3 04/16/2022   PREALBUMIN 20.6 04/17/2022    Lab Results  Component Value Date   MG 2.0 04/17/2022   No results found for: "VD25OH"  Lab Results  Component Value Date   PREALBUMIN 20.6 04/17/2022      Latest Ref Rng & Units 04/20/2022    1:38 AM 04/19/2022    2:04 AM 04/18/2022   12:48 AM  CBC EXTENDED  WBC 4.0 -  10.5 K/uL 8.7  13.5  8.8   RBC 4.22 - 5.81 MIL/uL 4.32  4.36  4.29   Hemoglobin 13.0 - 17.0 g/dL 62.1  30.8  65.7   HCT 39.0 - 52.0 % 40.1  39.7  39.7   Platelets 150 - 400 K/uL 306  324  281   NEUT# 1.7 - 7.7 K/uL  10.1  5.1   Lymph# 0.7 - 4.0 K/uL  2.0  2.4      There is no height or weight on file to calculate BMI.  Orders:  No orders of the defined types were placed in this encounter.  Meds ordered this encounter  Medications   oxyCODONE-acetaminophen (PERCOCET/ROXICET) 5-325 MG tablet    Sig: Take 1 tablet by mouth every 8 (eight) hours as needed for severe pain.    Dispense:  20 tablet    Refill:  0     Procedures: No procedures performed  Clinical Data: No additional findings.  ROS:  All other systems negative, except as noted in the HPI. Review of Systems  Objective: Vital Signs: There were no vitals taken for this visit.  Specialty Comments:  No specialty comments available.  PMFS History: Patient Active Problem List   Diagnosis Date Noted   Left foot infection 04/20/2022  BMI 50.0-59.9, adult (HCC) 04/20/2022   Obesity, morbid, BMI 50 or higher (HCC) 04/20/2022   Cutaneous abscess of left foot    Cellulitis of left foot    Cellulitis 04/16/2022   Leukocytosis 04/16/2022   Past Medical History:  Diagnosis Date   Autism    Cerebral palsy (HCC)     History reviewed. No pertinent family history.  Past Surgical History:  Procedure Laterality Date   FOOT CAPSULE RELEASE W/ PERCUTANEOUS HEEL CORD LENGTHENING, TIBIAL TENDON TRANSFER Bilateral    I & D EXTREMITY Left 04/18/2022   Procedure: IRRIGATION AND DEBRIDEMENT FOOT;  Surgeon: Nadara Mustard, MD;  Location: [facility] OR;  Service: Orthopedics;  Laterality: Left;   Social History   Occupational History   Not on file  Tobacco Use   Smoking status: Never   Smokeless tobacco: Never  Substance and Sexual Activity   Alcohol use: Never   Drug use: Never   Sexual activity: Not on file

## 2022-05-18 ENCOUNTER — Telehealth: Payer: Self-pay | Admitting: Orthopedic Surgery

## 2022-05-18 MED ORDER — OXYCODONE-ACETAMINOPHEN 5-325 MG PO TABS: 1.0000 | ORAL_TABLET | Freq: Three times a day (TID) | ORAL | 0 refills | Status: DC | PRN

## 2022-05-18 NOTE — Telephone Encounter (Signed)
Pt is s/p I&D left foot 04/18/2022 please advise.

## 2022-05-18 NOTE — Telephone Encounter (Signed)
Patient's mom called. He would like a refill on oxycodone.

## 2022-05-23 ENCOUNTER — Encounter: Payer: Self-pay | Admitting: Orthopedic Surgery

## 2022-05-23 ENCOUNTER — Ambulatory Visit (INDEPENDENT_AMBULATORY_CARE_PROVIDER_SITE_OTHER): Payer: Medicaid Other | Admitting: Orthopedic Surgery

## 2022-05-23 DIAGNOSIS — L02612 Cutaneous abscess of left foot: Secondary | ICD-10-CM

## 2022-05-23 MED ORDER — OXYCODONE-ACETAMINOPHEN 5-325 MG PO TABS: 1.0000 | ORAL_TABLET | Freq: Three times a day (TID) | ORAL | 0 refills | Status: AC | PRN

## 2022-05-23 NOTE — Progress Notes (Signed)
Office Visit Note   Patient: Albert Horne           Date of Birth: June 06, 1991           MRN: [id number] Visit Date: 05/23/2022              Requested by: No referring provider defined for this encounter. PCP: Patient, No Pcp Per  Chief Complaint  Patient presents with   Left Foot - Routine Post Op    04/18/2022 left foot I&D      HPI: Patient is a 31 year old gentleman who is over 4 weeks status post left foot irrigation and debridement.  Patient is nonweightbearing on doxycycline and dry dressing changes.  Assessment & Plan: Visit Diagnoses:  1. Cutaneous abscess of left foot     Plan: Sutures harvested today Iodosorb dressing applied continue with protected weightbearing dry dressing changes daily a prescription for Percocet called in  Follow-Up Instructions: Return in about 3 weeks (around 06/13/2022).   Ortho Exam  Patient is alert, oriented, no adenopathy, well-dressed, normal affect, normal respiratory effort. Examination patient still has swelling in the left foot there is no cellulitis odor or drainage.  The wound is gaped open about 2 x 1 cm.  Imaging: No results found. No images are attached to the encounter.  Labs: Lab Results  Component Value Date   HGBA1C 4.8 04/17/2022   ESRSEDRATE 16 04/16/2022   CRP 3.1 (H) 04/16/2022   REPTSTATUS 04/23/2022 FINAL 04/18/2022   GRAMSTAIN NO WBC SEEN NO ORGANISMS SEEN  04/18/2022   CULT  04/18/2022    FEW STAPHYLOCOCCUS LUGDUNENSIS NO ANAEROBES ISOLATED Performed at [facility], [address] [place], [place] [address]    [facility] STAPHYLOCOCCUS LUGDUNENSIS 04/18/2022     Lab Results  Component Value Date   ALBUMIN 3.5 04/17/2022   ALBUMIN 4.3 04/16/2022   PREALBUMIN 20.6 04/17/2022    Lab Results  Component Value Date   MG 2.0 04/17/2022   No results found for: "VD25OH"  Lab Results  Component Value Date   PREALBUMIN 20.6 04/17/2022      Latest Ref Rng & Units 04/20/2022    1:38  AM 04/19/2022    2:04 AM 04/18/2022   12:48 AM  CBC EXTENDED  WBC 4.0 - 10.5 K/uL 8.7  13.5  8.8   RBC 4.22 - 5.81 MIL/uL 4.32  4.36  4.29   Hemoglobin 13.0 - 17.0 g/dL 10.6  26.9  48.5   HCT 39.0 - 52.0 % 40.1  39.7  39.7   Platelets 150 - 400 K/uL 306  324  281   NEUT# 1.7 - 7.7 K/uL  10.1  5.1   Lymph# 0.7 - 4.0 K/uL  2.0  2.4      There is no height or weight on file to calculate BMI.  Orders:  No orders of the defined types were placed in this encounter.  Meds ordered this encounter  Medications   oxyCODONE-acetaminophen (PERCOCET/ROXICET) 5-325 MG tablet    Sig: Take 1 tablet by mouth every 8 (eight) hours as needed for severe pain.    Dispense:  20 tablet    Refill:  0     Procedures: No procedures performed  Clinical Data: No additional findings.  ROS:  All other systems negative, except as noted in the HPI. Review of Systems  Objective: Vital Signs: There were no vitals taken for this visit.  Specialty Comments:  No specialty comments available.  PMFS History: Patient Active  Problem List   Diagnosis Date Noted   Left foot infection 04/20/2022   BMI 50.0-59.9, adult (HCC) 04/20/2022   Obesity, morbid, BMI 50 or higher (HCC) 04/20/2022   Cutaneous abscess of left foot    Cellulitis of left foot    Cellulitis 04/16/2022   Leukocytosis 04/16/2022   Past Medical History:  Diagnosis Date   Autism    Cerebral palsy (HCC)     History reviewed. No pertinent family history.  Past Surgical History:  Procedure Laterality Date   FOOT CAPSULE RELEASE W/ PERCUTANEOUS HEEL CORD LENGTHENING, TIBIAL TENDON TRANSFER Bilateral    I & D EXTREMITY Left 04/18/2022   Procedure: IRRIGATION AND DEBRIDEMENT FOOT;  Surgeon: Nadara Mustard, MD;  Location: [facility] OR;  Service: Orthopedics;  Laterality: Left;   Social History   Occupational History   Not on file  Tobacco Use   Smoking status: Never   Smokeless tobacco: Never  Substance and Sexual Activity   Alcohol use:  Never   Drug use: Never   Sexual activity: Not on file

## 2022-06-13 ENCOUNTER — Ambulatory Visit: Payer: Medicaid Other | Admitting: Orthopedic Surgery

## 2022-06-20 ENCOUNTER — Ambulatory Visit (INDEPENDENT_AMBULATORY_CARE_PROVIDER_SITE_OTHER): Payer: Medicaid Other | Admitting: Orthopedic Surgery

## 2022-06-20 ENCOUNTER — Encounter: Payer: Self-pay | Admitting: Orthopedic Surgery

## 2022-06-20 DIAGNOSIS — L02612 Cutaneous abscess of left foot: Secondary | ICD-10-CM | POA: Diagnosis not present

## 2022-06-20 NOTE — Progress Notes (Signed)
Office Visit Note   Patient: Albert Horne           Date of Birth: May 15, 1991           MRN: [id number] Visit Date: 06/20/2022              Requested by: No referring provider defined for this encounter. PCP: Patient, No Pcp Per  Chief Complaint  Patient presents with   Left Foot - Follow-up    I&D of left foot 04/18/2022      HPI: Patient presents for follow-up 2 months status post debridement abscess left foot.  Assessment & Plan: Visit Diagnoses:  1. Cutaneous abscess of left foot     Plan: Patient will increase activities as tolerated follow-up in 4 weeks.  Follow-Up Instructions: No follow-ups on file.   Ortho Exam  Patient is alert, oriented, no adenopathy, well-dressed, normal affect, normal respiratory effort. Examination the incision dorsal left foot continues to improve there is no redness no cellulitis there is some swelling with good epithelization.  Patient has completed his antibiotics.  Patient has a Wagner grade 1 ulcer on the left great toe.  After informed consent a 10 blade knife was used to debride the skin and soft tissue back to healthy viable tissue.  The wound measures 10 x 20 mm after debridement and 3 mm deep.  No signs of infection.  Imaging: No results found. No images are attached to the encounter.  Labs: Lab Results  Component Value Date   HGBA1C 4.8 04/17/2022   ESRSEDRATE 16 04/16/2022   CRP 3.1 (H) 04/16/2022   REPTSTATUS 04/23/2022 FINAL 04/18/2022   GRAMSTAIN NO WBC SEEN NO ORGANISMS SEEN  04/18/2022   CULT  04/18/2022    FEW STAPHYLOCOCCUS LUGDUNENSIS NO ANAEROBES ISOLATED Performed at [facility], [address], [place], [place] [address]    [facility] STAPHYLOCOCCUS LUGDUNENSIS 04/18/2022     Lab Results  Component Value Date   ALBUMIN 3.5 04/17/2022   ALBUMIN 4.3 04/16/2022   PREALBUMIN 20.6 04/17/2022    Lab Results  Component Value Date   MG 2.0 04/17/2022   No results found for: "VD25OH"  Lab  Results  Component Value Date   PREALBUMIN 20.6 04/17/2022      Latest Ref Rng & Units 04/20/2022    1:38 AM 04/19/2022    2:04 AM 04/18/2022   12:48 AM  CBC EXTENDED  WBC 4.0 - 10.5 K/uL 8.7  13.5  8.8   RBC 4.22 - 5.81 MIL/uL 4.32  4.36  4.29   Hemoglobin 13.0 - 17.0 g/dL 28.4  13.2  44.0   HCT 39.0 - 52.0 % 40.1  39.7  39.7   Platelets 150 - 400 K/uL 306  324  281   NEUT# 1.7 - 7.7 K/uL  10.1  5.1   Lymph# 0.7 - 4.0 K/uL  2.0  2.4      There is no height or weight on file to calculate BMI.  Orders:  No orders of the defined types were placed in this encounter.  No orders of the defined types were placed in this encounter.    Procedures: No procedures performed  Clinical Data: No additional findings.  ROS:  All other systems negative, except as noted in the HPI. Review of Systems  Objective: Vital Signs: There were no vitals taken for this visit.  Specialty Comments:  No specialty comments available.  PMFS History: Patient Active Problem List   Diagnosis Date Noted  Left foot infection 04/20/2022   BMI 50.0-59.9, adult (HCC) 04/20/2022   Obesity, morbid, BMI 50 or higher (HCC) 04/20/2022   Cutaneous abscess of left foot    Cellulitis of left foot    Cellulitis 04/16/2022   Leukocytosis 04/16/2022   Past Medical History:  Diagnosis Date   Autism    Cerebral palsy (HCC)     History reviewed. No pertinent family history.  Past Surgical History:  Procedure Laterality Date   FOOT CAPSULE RELEASE W/ PERCUTANEOUS HEEL CORD LENGTHENING, TIBIAL TENDON TRANSFER Bilateral    I & D EXTREMITY Left 04/18/2022   Procedure: IRRIGATION AND DEBRIDEMENT FOOT;  Surgeon: Nadara Mustard, MD;  Location: [facility] OR;  Service: Orthopedics;  Laterality: Left;   Social History   Occupational History   Not on file  Tobacco Use   Smoking status: Never   Smokeless tobacco: Never  Substance and Sexual Activity   Alcohol use: Never   Drug use: Never   Sexual activity: Not on  file

## 2022-07-18 ENCOUNTER — Encounter: Payer: Medicaid Other | Admitting: Orthopedic Surgery

## 2022-07-26 ENCOUNTER — Encounter: Payer: Medicaid Other | Admitting: Family

## 2022-10-26 ENCOUNTER — Telehealth: Payer: Self-pay | Admitting: Orthopedic Surgery

## 2022-10-26 NOTE — Telephone Encounter (Signed)
I see he is coming in to see Erin on 10/28/22.

## 2022-10-26 NOTE — Telephone Encounter (Signed)
Patient's mother called and advised Patient has swelling to area of surgery site. Please call..[phone].. transferred to Triage Nurse.

## 2022-10-26 NOTE — Telephone Encounter (Signed)
Can you call and see if he can come at 3:15 pm tomorrow with Dr. Lajoyce Corners? He needs to be seen for this. Thank you!

## 2022-10-28 ENCOUNTER — Ambulatory Visit: Payer: Medicaid Other | Admitting: Family

## 2023-08-29 ENCOUNTER — Emergency Department (HOSPITAL_COMMUNITY): Payer: Medicaid Other

## 2023-08-29 ENCOUNTER — Emergency Department (HOSPITAL_COMMUNITY)
Admission: EM | Admit: 2023-08-29 | Discharge: 2023-08-29 | Disposition: A | Discharge status: Home / Self Care | Payer: Medicaid Other | Attending: Emergency Medicine | Admitting: Emergency Medicine

## 2023-08-29 DIAGNOSIS — F840 Autistic disorder: Secondary | ICD-10-CM | POA: Insufficient documentation

## 2023-08-29 DIAGNOSIS — Y9389 Activity, other specified: Secondary | ICD-10-CM | POA: Diagnosis not present

## 2023-08-29 DIAGNOSIS — W010XXA Fall on same level from slipping, tripping and stumbling without subsequent striking against object, initial encounter: Secondary | ICD-10-CM | POA: Insufficient documentation

## 2023-08-29 DIAGNOSIS — M7051 Other bursitis of knee, right knee: Secondary | ICD-10-CM | POA: Insufficient documentation

## 2023-08-29 DIAGNOSIS — M25561 Pain in right knee: Secondary | ICD-10-CM | POA: Diagnosis present

## 2023-08-29 MED ORDER — IBUPROFEN 800 MG PO TABS
800.0000 mg | ORAL_TABLET | Freq: Once | ORAL | Status: AC
  Administered 2023-08-29: 800 mg via ORAL
  Filled 2023-08-29: qty 1

## 2023-08-29 MED ORDER — IBUPROFEN 600 MG PO TABS
600.0000 mg | ORAL_TABLET | Freq: Three times a day (TID) | ORAL | 0 refills | Status: AC
Start: 2023-08-29 — End: ?

## 2023-08-29 NOTE — ED Triage Notes (Signed)
Pt bib mother with complaints of right knee and right ankle pain after falling last night. Denies any other pain. Pt has cerebral palsy and doesn't have great balance. Denies any dizziness. Did not hit head

## 2023-08-29 NOTE — Discharge Instructions (Addendum)
Use the ibuprofen prescribed, also recommend application of ice for good 10 minutes several times daily.  You may also add a heating pad which can also help alleviate bursitis pain symptoms.

## 2023-08-30 NOTE — ED Provider Notes (Signed)
[place] EMERGENCY DEPARTMENT AT [facility] Provider Note   CSN: [id number] Arrival date & time: 08/29/23  1318     History  Chief Complaint  Patient presents with   Fall   Knee Pain    Albert Horne is a 32 y.o. male with a history including cerebral palsy, autism who developed cellulitis and abscess of his left dorsal foot over a year ago under the care of Dr. Lajoyce Corners presenting for evaluation of a tender swelling at his right upper tibia.  The swelling developed after he tripped and fell last night, also with complaints of pain at his medial right ankle since the fall.  With his cerebral palsy he does not have good balance and tripping and falling is something that occasionally happens.  He did not hit his head, no LOC.  He has been able to weight-bear but endorses pain.  He walks independently without use of brace, walker or any medical devices.  He has had no treatment for symptoms prior to arrival.  The history is provided by the patient and a parent.       Home Medications Prior to Admission medications   Medication Sig Start Date End Date Taking? Authorizing Provider  ibuprofen (ADVIL) 600 MG tablet Take 1 tablet (600 mg total) by mouth 3 (three) times daily. 08/29/23  Yes Quamel Fitzmaurice, Raynelle Fanning, PA-C  doxycycline (VIBRA-TABS) 100 MG tablet Take 1 tablet (100 mg total) by mouth 2 (two) times daily. 04/25/22   Nadara Mustard, MD  oxyCODONE-acetaminophen (PERCOCET/ROXICET) 5-325 MG tablet Take 1 tablet by mouth every 8 (eight) hours as needed for severe pain. 05/23/22   Nadara Mustard, MD      Allergies    Patient has no known allergies.    Review of Systems   Review of Systems  Constitutional:  Negative for chills and fever.  Musculoskeletal:  Positive for arthralgias. Negative for joint swelling and myalgias.  Neurological:  Negative for weakness and numbness.  All other systems reviewed and are negative.   Physical Exam Updated Vital Signs BP (!) 146/98 (BP  Location: Right Arm)   Pulse 60   Temp (!) 97.5 F (36.4 C) (Temporal)   Resp 16   SpO2 98%  Physical Exam Constitutional:      Appearance: He is well-developed.  HENT:     Head: Atraumatic.  Cardiovascular:     Comments: Pulses equal bilaterally Musculoskeletal:        General: Tenderness present.     Cervical back: Normal range of motion.     Right knee: Swelling present. No erythema, bony tenderness or crepitus. No LCL laxity or MCL laxity. Normal patellar mobility. Normal pulse.     Right ankle: No swelling or deformity. Tenderness present. Decreased range of motion. Anterior drawer test negative. Normal pulse.     Comments: He is tender to palpation at the right medial dorsal talar region, there is no palpable deformity, erythema or edema, no bruising.  He has reduced dorsiflexion at the ankle which she endorses as his baseline.  Distal sensation is intact, dorsalis pedal pulses full.  He has a soft edema right infrapatellar anteriorly, mildly tender, consistent with a bursitis.  There is no increased warmth or erythema suggesting infection.  His skin is intact.  Skin:    General: Skin is warm and dry.     Findings: No erythema or lesion.  Neurological:     Mental Status: He is alert.     Sensory: No  sensory deficit.     Motor: No weakness.     Deep Tendon Reflexes: Reflexes normal.     ED Results / Procedures / Treatments   Labs (all labs ordered are listed, but only abnormal results are displayed) Labs Reviewed - No data to display  EKG None  Radiology DG Ankle 2 Views Right  Result Date: 08/29/2023 CLINICAL DATA:  Right ankle and knee pain after fall. EXAM: RIGHT ANKLE - 2 VIEW COMPARISON:  None Available. FINDINGS: No fracture or dislocation. The ankle mortise is preserved. Limited assessment for joint effusion on the lateral view due to positioning. Chronic corticated densities over the dorsal talus and navicular. 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 No erosive change. Mild soft  tissue edema. IMPRESSION: 1. No fracture or dislocation of the right ankle. 2. 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 Electronically Signed   By: Narda Rutherford M.D.   On: 08/29/2023 14:29   DG Knee Complete 4 Views Right  Result Date: 08/29/2023 CLINICAL DATA:  Right knee and ankle pain after fall. EXAM: RIGHT KNEE - COMPLETE 4+ VIEW COMPARISON:  None Available. FINDINGS: No evidence of fracture, dislocation, or joint effusion. No evidence of arthropathy or other focal bone abnormality. Soft tissue thickening anteriorly over the upper tibia. IMPRESSION: Soft tissue thickening anteriorly over the upper tibia. No fracture or joint effusion. Electronically Signed   By: Narda Rutherford M.D.   On: 08/29/2023 14:28    Procedures Procedures    Medications Ordered in ED Medications  ibuprofen (ADVIL) tablet 800 mg (800 mg Oral Given 08/29/23 1845)    ED Course/ Medical Decision Making/ A&P                                 Medical Decision Making Patient presenting with right ankle and knee pain after a trip and fall which occurred yesterday.  Imaging reviewed and is reassuring with no fracture or dislocation.  He does have some soft tissue swelling at his inferior right anterior knee consistent with an exam suggest a traumatic bursitis.  His skin is intact, there is no evidence to suggest an infected bursa.  We discussed home treatment including ice and elevation, may also try heat however ice will be better for inflammation.  He had used ice and heat at home and felt the heat helped it feel better.  He is placed on prescription strength ibuprofen as well.  We discussed possible treatments for his ankle pain, he may have a mild sprain, we discussed placing him in an ASO but both he and his mom were concerned that it could interfere with his gait more than it would be helpful.  He is comfortable ambulating without assistance or bracing of his ankle.  He does have an appointment with Dr. Lajoyce Corners in 3 days, he was encouraged  to keep this appointment.  Amount and/or Complexity of Data Reviewed Radiology: ordered.  Risk Prescription drug management.           Final Clinical Impression(s) / ED Diagnoses Final diagnoses:  Infrapatellar bursitis of right knee    Rx / DC Orders ED Discharge Orders          Ordered    ibuprofen (ADVIL) 600 MG tablet  3 times daily        08/29/23 1840              Burgess Amor, Cordelia Poche 08/30/23 1221    Gloris Manchester, MD 09/04/23 [phone]

## 2023-09-14 ENCOUNTER — Ambulatory Visit: Payer: Medicaid Other | Admitting: Orthopedic Surgery

## 2024-01-01 IMAGING — MR MR FOOT*L* WO/W CM
9 series · 40 of 40 positions shown · IV contrast (gadavist)
Comparison: None Available.

CLINICAL DATA: Foot swelling, nondiabetic. Osteomyelitis suspected.

EXAM:
MRI OF THE LEFT FOREFOOT WITHOUT AND WITH CONTRAST
TECHNIQUE: Multiplanar, multisequence MR imaging of the left was performed both
before and after administration of intravenous contrast.
CONTRAST:  10mL GADAVIST GADOBUTROL 1 MMOL/ML IV SOLN

[Series 4: T1 · coronal · left · 3.0mm · 0.47mm/px · 5 of 44 slices shown (1 of 2)]
[im 1/44]
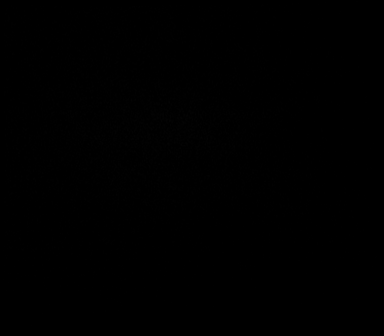
[im 11/44]
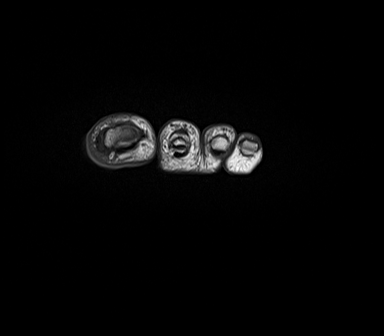
[im 22/44]
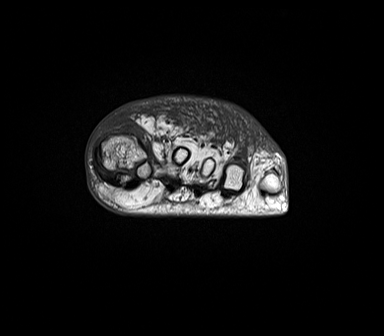
[im 33/44]
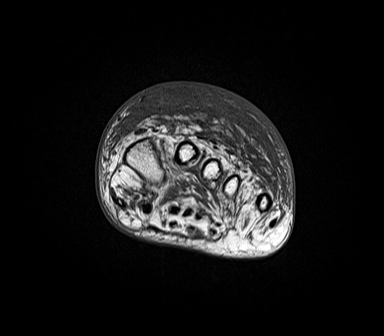
[im 44/44]
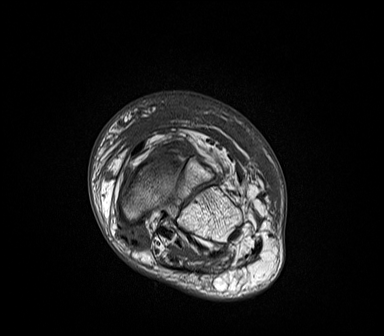

[Series 5: T2 fat-sat · coronal · left · 3.0mm · 0.49mm/px · 5 of 44 slices shown (1 of 2)]
[im 1/44]
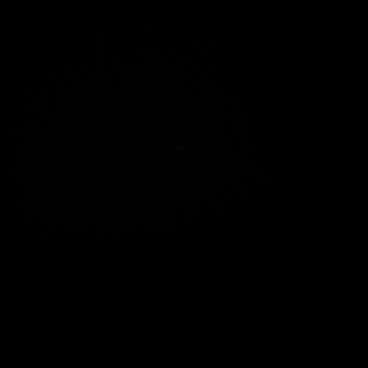
[im 11/44]
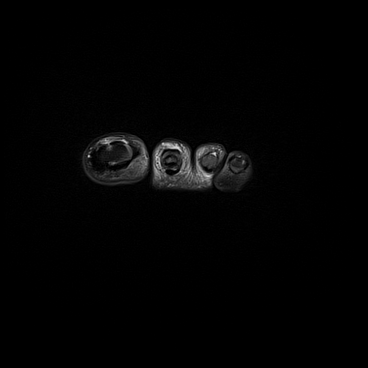
[im 22/44]
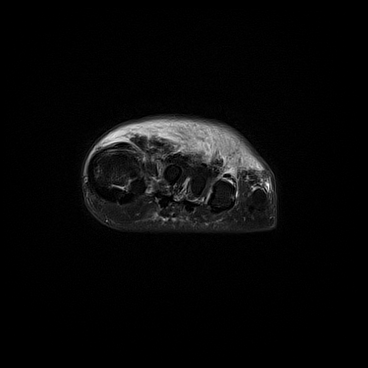
[im 33/44]
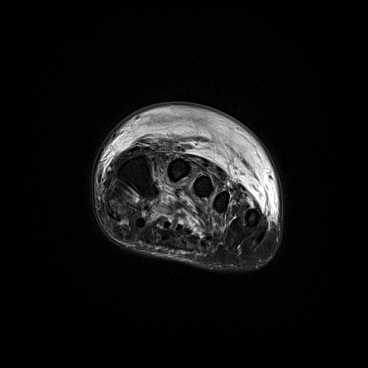
[im 44/44]
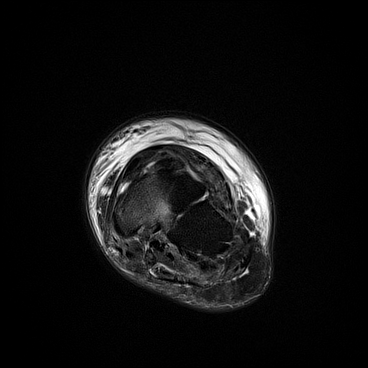

[Series 6: T1 · axial · left · 3.0mm · 0.52mm/px · z∈[-124,-44]mm · 3 of 24 slices shown (2 of 2)]
[im 1/24]
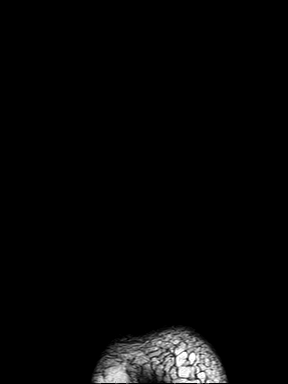
[im 12/24]
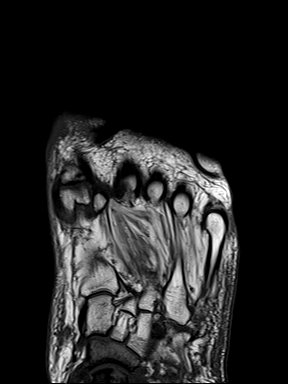
[im 24/24]
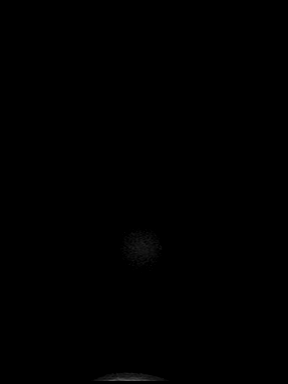

[Series 8: T2 fat-sat · axial · left · 3.0mm · 0.60mm/px · z∈[-120,-40]mm · 3 of 24 slices shown (2 of 2)]
[im 1/24]
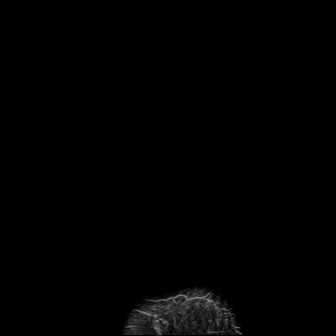
[im 12/24]
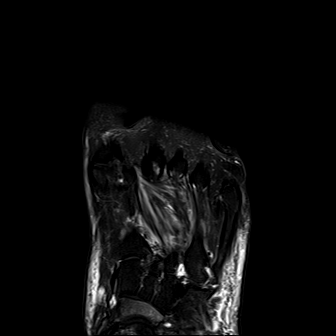
[im 24/24]
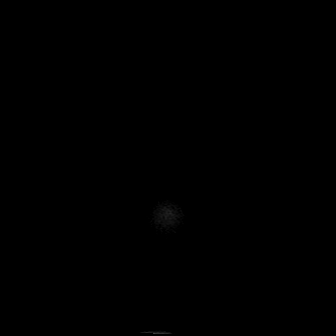

[Series 9: STIR · sagittal · left · 3.0mm · 0.70mm/px · 4 of 30 slices shown]
[im 1/30]
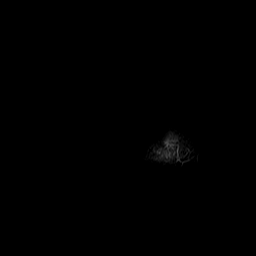
[im 10/30]
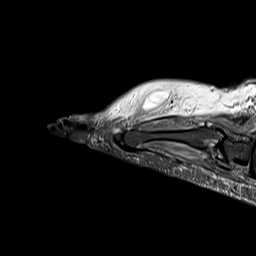
[im 20/30]
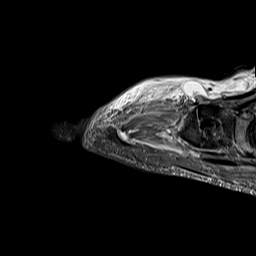
[im 30/30]
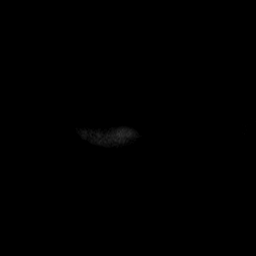

[Series 10: T1 fat-sat · coronal · non-contrast · left · 3.0mm · 0.59mm/px · 6 of 44 slices shown (1 of 3)]
[im 1/44]
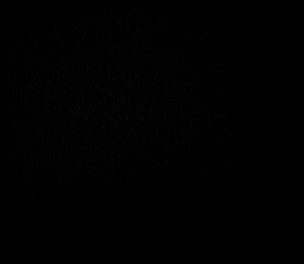
[im 9/44]
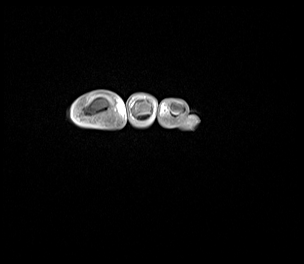
[im 18/44]
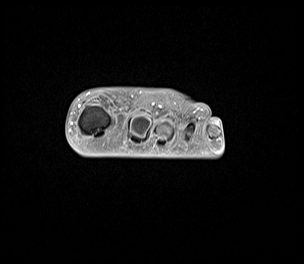
[im 26/44]
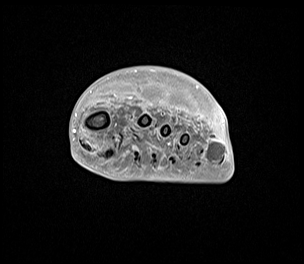
[im 35/44]
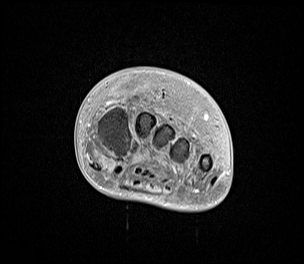
[im 44/44]
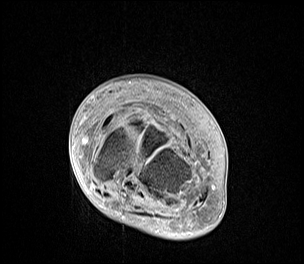

[Series 11: T1 fat-sat post-contrast · coronal · left · 3.0mm · 0.59mm/px · 6 of 44 slices shown]
[im 1/44]
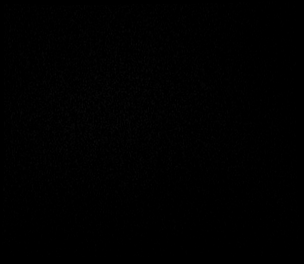
[im 9/44]
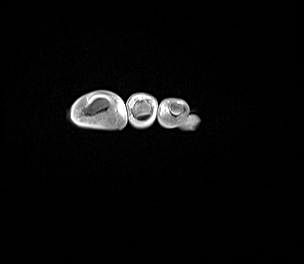
[im 18/44]
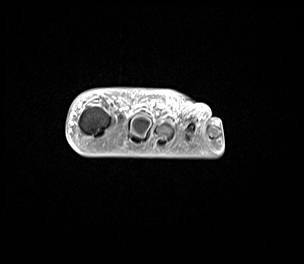
[im 26/44]
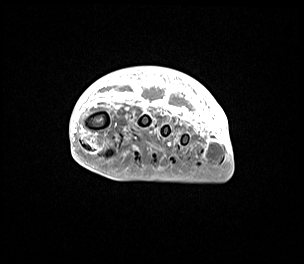
[im 35/44]
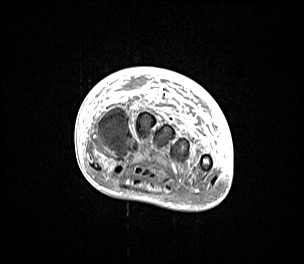
[im 44/44]
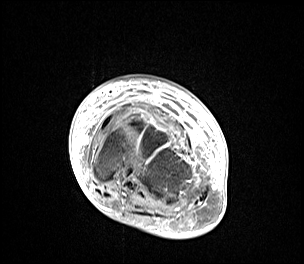

[Series 12: T1 fat-sat · sagittal · left · 3.0mm · 0.59mm/px · 5 of 42 slices shown (2 of 3)]
[im 1/42]
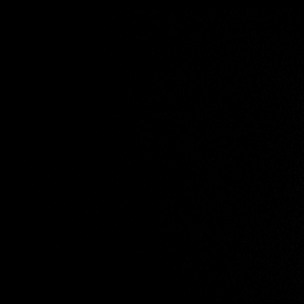
[im 11/42]
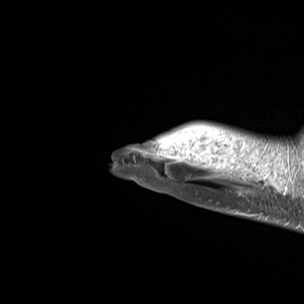
[im 21/42]
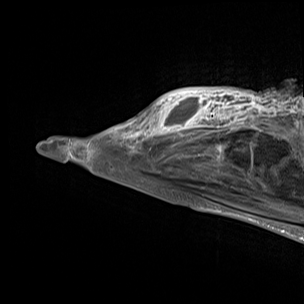
[im 31/42]
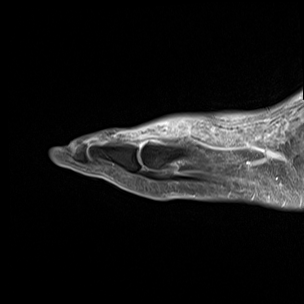
[im 42/42]
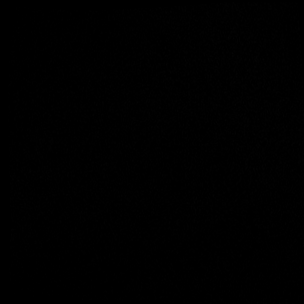

[Series 13: T1 fat-sat · axial · left · 3.0mm · 0.62mm/px · z∈[-120,-40]mm · 3 of 24 slices shown (3 of 3)]
[im 1/24]
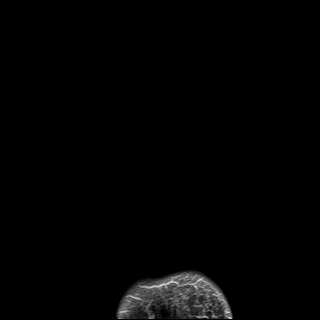
[im 12/24]
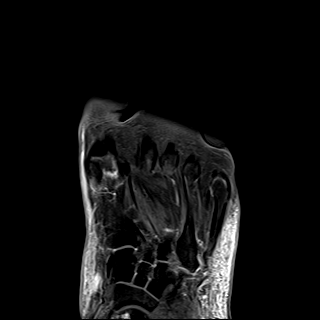
[im 24/24]
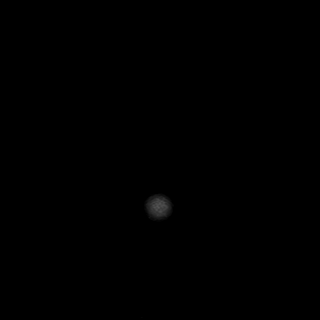

[40 of 40 positions shown; findings below may reference images not displayed]

FINDINGS: Bones/Joint/Cartilage

There is bone marrow edema of the partially imaged navicular bone.
There are however osteophytes and osseous fragmentations at the
talonavicular joint suggesting chronic arthropathy. Subchondral
cystic changes at the first metatarsophalangeal joint.

Ligaments

Collateral ligaments are intact.  Lisfranc ligament is intact.

Muscles and Tendons

Flexor, peroneal and extensor compartment tendons are intact.
Increased intrasubstance signal of the plantar muscles suggesting
myopathy/myositis. No fluid collection or abscess.

Soft tissue
There are multiple peripherally enhancing subcutaneous fluid
collection on dorsum of the foot, there is a distal collection at
the level of the metatarsal shafts measuring at least 1.3 x 5.7 x
2.2 cm. There is another proximal fluid collection measuring
approximately 1.0 x 2.4 x 1.7 cm at the level of the first
tarsometatarsal joint. No soft tissue mass. Marked skin thickening
and subcutaneous soft tissue edema consistent with cellulitis.
IMPRESSION: 1. Bone marrow edema of the navicular bone which is partially imaged
on this examination. This edema could be reactive secondary to
chronic ongoing osteomyelitis or sequela of recent trauma. In the
absence of adjacent ongoing infectious/inflammatory process with two
abscesses on dorsum of the foot, osteomyelitis can not be completely
excluded.

2. Peripherally enhancing fluid collections consistent with
abscesses on dorsum of the foot, the distal which is larger and at
the level of the all metatarsal shafts measuring at least 1.3 x
x 2.2 cm and a small proximal over the first metatarsophalangeal
joint measuring at least 1.0 x 2.4 x 1.7 cm.

3. No appreciable deep skin ulcer identified on this examination,
clinical correlation is suggested. Marked skin thickening and
subcutaneous soft tissue edema consistent with severe cellulitis.
